# Patient Record
Sex: Male | Born: 1945 | Race: White | Hispanic: No | Marital: Married | Smoking: Former smoker
Health system: Southern US, Community
[De-identification: ages and names within clinical notes are randomized; demographics above are authoritative.]

## PROBLEM LIST (undated history)

## (undated) DIAGNOSIS — J45909 Unspecified asthma, uncomplicated: Secondary | ICD-10-CM

## (undated) DIAGNOSIS — E7800 Pure hypercholesterolemia, unspecified: Secondary | ICD-10-CM

## (undated) DIAGNOSIS — J189 Pneumonia, unspecified organism: Secondary | ICD-10-CM

## (undated) HISTORY — PX: ROTATOR CUFF REPAIR: SHX139

## (undated) HISTORY — PX: COLONOSCOPY: SHX174

## (undated) HISTORY — DX: Pneumonia, unspecified organism: J18.9

## (undated) HISTORY — DX: Unspecified asthma, uncomplicated: J45.909

## (undated) HISTORY — PX: CARPAL TUNNEL RELEASE: SHX101

## (undated) HISTORY — PX: ESOPHAGOGASTRODUODENOSCOPY: SHX1529

## (undated) HISTORY — PX: HERNIA REPAIR: SHX51

## (undated) HISTORY — PX: BRONCHOSCOPY: SUR163

## (undated) HISTORY — DX: Pure hypercholesterolemia, unspecified: E78.00

---

## 2015-05-18 DIAGNOSIS — H2512 Age-related nuclear cataract, left eye: Secondary | ICD-10-CM | POA: Diagnosis not present

## 2015-05-18 DIAGNOSIS — H26491 Other secondary cataract, right eye: Secondary | ICD-10-CM | POA: Diagnosis not present

## 2015-07-14 DIAGNOSIS — H1859 Other hereditary corneal dystrophies: Secondary | ICD-10-CM | POA: Diagnosis not present

## 2015-07-14 DIAGNOSIS — H25812 Combined forms of age-related cataract, left eye: Secondary | ICD-10-CM | POA: Diagnosis not present

## 2015-08-09 DIAGNOSIS — H1859 Other hereditary corneal dystrophies: Secondary | ICD-10-CM | POA: Diagnosis not present

## 2015-08-09 DIAGNOSIS — H25812 Combined forms of age-related cataract, left eye: Secondary | ICD-10-CM | POA: Diagnosis not present

## 2015-08-16 DIAGNOSIS — H1859 Other hereditary corneal dystrophies: Secondary | ICD-10-CM | POA: Diagnosis not present

## 2015-08-25 DIAGNOSIS — M1990 Unspecified osteoarthritis, unspecified site: Secondary | ICD-10-CM | POA: Diagnosis not present

## 2015-08-25 DIAGNOSIS — F419 Anxiety disorder, unspecified: Secondary | ICD-10-CM | POA: Diagnosis not present

## 2015-08-25 DIAGNOSIS — Z6829 Body mass index (BMI) 29.0-29.9, adult: Secondary | ICD-10-CM | POA: Diagnosis not present

## 2015-08-25 DIAGNOSIS — J449 Chronic obstructive pulmonary disease, unspecified: Secondary | ICD-10-CM | POA: Diagnosis not present

## 2015-08-25 DIAGNOSIS — Z125 Encounter for screening for malignant neoplasm of prostate: Secondary | ICD-10-CM | POA: Diagnosis not present

## 2015-08-25 DIAGNOSIS — J189 Pneumonia, unspecified organism: Secondary | ICD-10-CM | POA: Diagnosis not present

## 2015-08-25 DIAGNOSIS — R739 Hyperglycemia, unspecified: Secondary | ICD-10-CM | POA: Diagnosis not present

## 2015-08-25 DIAGNOSIS — G4700 Insomnia, unspecified: Secondary | ICD-10-CM | POA: Diagnosis not present

## 2015-08-25 DIAGNOSIS — R634 Abnormal weight loss: Secondary | ICD-10-CM | POA: Diagnosis not present

## 2015-08-25 DIAGNOSIS — M62838 Other muscle spasm: Secondary | ICD-10-CM | POA: Diagnosis not present

## 2015-08-25 DIAGNOSIS — E663 Overweight: Secondary | ICD-10-CM | POA: Diagnosis not present

## 2015-08-25 DIAGNOSIS — E785 Hyperlipidemia, unspecified: Secondary | ICD-10-CM | POA: Diagnosis not present

## 2015-10-10 DIAGNOSIS — Z79899 Other long term (current) drug therapy: Secondary | ICD-10-CM | POA: Diagnosis not present

## 2015-10-10 DIAGNOSIS — H25812 Combined forms of age-related cataract, left eye: Secondary | ICD-10-CM | POA: Diagnosis not present

## 2015-10-10 DIAGNOSIS — Z87891 Personal history of nicotine dependence: Secondary | ICD-10-CM | POA: Diagnosis not present

## 2015-10-10 DIAGNOSIS — K219 Gastro-esophageal reflux disease without esophagitis: Secondary | ICD-10-CM | POA: Diagnosis not present

## 2015-10-10 DIAGNOSIS — E785 Hyperlipidemia, unspecified: Secondary | ICD-10-CM | POA: Diagnosis not present

## 2015-10-10 DIAGNOSIS — H9190 Unspecified hearing loss, unspecified ear: Secondary | ICD-10-CM | POA: Diagnosis not present

## 2015-12-04 DIAGNOSIS — J189 Pneumonia, unspecified organism: Secondary | ICD-10-CM | POA: Diagnosis not present

## 2015-12-04 DIAGNOSIS — F419 Anxiety disorder, unspecified: Secondary | ICD-10-CM | POA: Diagnosis not present

## 2015-12-04 DIAGNOSIS — M1990 Unspecified osteoarthritis, unspecified site: Secondary | ICD-10-CM | POA: Diagnosis not present

## 2015-12-04 DIAGNOSIS — J449 Chronic obstructive pulmonary disease, unspecified: Secondary | ICD-10-CM | POA: Diagnosis not present

## 2015-12-04 DIAGNOSIS — Z6829 Body mass index (BMI) 29.0-29.9, adult: Secondary | ICD-10-CM | POA: Diagnosis not present

## 2015-12-04 DIAGNOSIS — E785 Hyperlipidemia, unspecified: Secondary | ICD-10-CM | POA: Diagnosis not present

## 2016-02-09 DIAGNOSIS — Z0000 Encounter for general adult medical examination without abnormal findings: Secondary | ICD-10-CM | POA: Diagnosis not present

## 2016-02-09 DIAGNOSIS — E785 Hyperlipidemia, unspecified: Secondary | ICD-10-CM | POA: Diagnosis not present

## 2016-02-09 DIAGNOSIS — Z6829 Body mass index (BMI) 29.0-29.9, adult: Secondary | ICD-10-CM | POA: Diagnosis not present

## 2016-02-09 DIAGNOSIS — F419 Anxiety disorder, unspecified: Secondary | ICD-10-CM | POA: Diagnosis not present

## 2016-02-09 DIAGNOSIS — M62838 Other muscle spasm: Secondary | ICD-10-CM | POA: Diagnosis not present

## 2016-02-09 DIAGNOSIS — G4700 Insomnia, unspecified: Secondary | ICD-10-CM | POA: Diagnosis not present

## 2016-02-09 DIAGNOSIS — M1990 Unspecified osteoarthritis, unspecified site: Secondary | ICD-10-CM | POA: Diagnosis not present

## 2016-02-09 DIAGNOSIS — Z23 Encounter for immunization: Secondary | ICD-10-CM | POA: Diagnosis not present

## 2016-02-09 DIAGNOSIS — J449 Chronic obstructive pulmonary disease, unspecified: Secondary | ICD-10-CM | POA: Diagnosis not present

## 2016-02-12 DIAGNOSIS — H26493 Other secondary cataract, bilateral: Secondary | ICD-10-CM | POA: Diagnosis not present

## 2016-02-12 DIAGNOSIS — H1850 Unspecified hereditary corneal dystrophies: Secondary | ICD-10-CM | POA: Diagnosis not present

## 2016-07-09 DIAGNOSIS — H1850 Unspecified hereditary corneal dystrophies: Secondary | ICD-10-CM | POA: Diagnosis not present

## 2016-08-22 DIAGNOSIS — J449 Chronic obstructive pulmonary disease, unspecified: Secondary | ICD-10-CM | POA: Diagnosis not present

## 2016-08-22 DIAGNOSIS — M1990 Unspecified osteoarthritis, unspecified site: Secondary | ICD-10-CM | POA: Diagnosis not present

## 2016-08-22 DIAGNOSIS — G4700 Insomnia, unspecified: Secondary | ICD-10-CM | POA: Diagnosis not present

## 2016-08-22 DIAGNOSIS — E785 Hyperlipidemia, unspecified: Secondary | ICD-10-CM | POA: Diagnosis not present

## 2016-08-22 DIAGNOSIS — Z6829 Body mass index (BMI) 29.0-29.9, adult: Secondary | ICD-10-CM | POA: Diagnosis not present

## 2016-08-22 DIAGNOSIS — M62838 Other muscle spasm: Secondary | ICD-10-CM | POA: Diagnosis not present

## 2016-08-22 DIAGNOSIS — R739 Hyperglycemia, unspecified: Secondary | ICD-10-CM | POA: Diagnosis not present

## 2016-08-22 DIAGNOSIS — M25511 Pain in right shoulder: Secondary | ICD-10-CM | POA: Diagnosis not present

## 2016-08-22 DIAGNOSIS — R221 Localized swelling, mass and lump, neck: Secondary | ICD-10-CM | POA: Diagnosis not present

## 2016-08-22 DIAGNOSIS — F419 Anxiety disorder, unspecified: Secondary | ICD-10-CM | POA: Diagnosis not present

## 2016-08-28 DIAGNOSIS — K219 Gastro-esophageal reflux disease without esophagitis: Secondary | ICD-10-CM | POA: Diagnosis not present

## 2016-08-28 DIAGNOSIS — R1310 Dysphagia, unspecified: Secondary | ICD-10-CM | POA: Diagnosis not present

## 2016-10-02 DIAGNOSIS — R112 Nausea with vomiting, unspecified: Secondary | ICD-10-CM | POA: Diagnosis not present

## 2016-10-02 DIAGNOSIS — Z6828 Body mass index (BMI) 28.0-28.9, adult: Secondary | ICD-10-CM | POA: Diagnosis not present

## 2016-10-02 DIAGNOSIS — K2971 Gastritis, unspecified, with bleeding: Secondary | ICD-10-CM | POA: Diagnosis not present

## 2016-10-02 DIAGNOSIS — R195 Other fecal abnormalities: Secondary | ICD-10-CM | POA: Diagnosis not present

## 2016-10-02 DIAGNOSIS — R1310 Dysphagia, unspecified: Secondary | ICD-10-CM | POA: Diagnosis not present

## 2016-10-02 DIAGNOSIS — K922 Gastrointestinal hemorrhage, unspecified: Secondary | ICD-10-CM | POA: Diagnosis not present

## 2016-10-03 DIAGNOSIS — K259 Gastric ulcer, unspecified as acute or chronic, without hemorrhage or perforation: Secondary | ICD-10-CM | POA: Diagnosis not present

## 2016-10-03 DIAGNOSIS — Z79899 Other long term (current) drug therapy: Secondary | ICD-10-CM | POA: Diagnosis not present

## 2016-10-03 DIAGNOSIS — K253 Acute gastric ulcer without hemorrhage or perforation: Secondary | ICD-10-CM | POA: Diagnosis not present

## 2016-10-03 DIAGNOSIS — D649 Anemia, unspecified: Secondary | ICD-10-CM | POA: Diagnosis not present

## 2016-10-03 DIAGNOSIS — K921 Melena: Secondary | ICD-10-CM | POA: Diagnosis not present

## 2016-10-03 DIAGNOSIS — Z86010 Personal history of colonic polyps: Secondary | ICD-10-CM | POA: Diagnosis not present

## 2016-10-03 DIAGNOSIS — K219 Gastro-esophageal reflux disease without esophagitis: Secondary | ICD-10-CM | POA: Diagnosis not present

## 2016-10-10 DIAGNOSIS — D649 Anemia, unspecified: Secondary | ICD-10-CM | POA: Diagnosis not present

## 2016-10-25 DIAGNOSIS — R739 Hyperglycemia, unspecified: Secondary | ICD-10-CM | POA: Diagnosis not present

## 2016-10-25 DIAGNOSIS — Z6828 Body mass index (BMI) 28.0-28.9, adult: Secondary | ICD-10-CM | POA: Diagnosis not present

## 2016-10-25 DIAGNOSIS — J449 Chronic obstructive pulmonary disease, unspecified: Secondary | ICD-10-CM | POA: Diagnosis not present

## 2016-10-25 DIAGNOSIS — K2971 Gastritis, unspecified, with bleeding: Secondary | ICD-10-CM | POA: Diagnosis not present

## 2017-02-13 DIAGNOSIS — Z79899 Other long term (current) drug therapy: Secondary | ICD-10-CM | POA: Diagnosis not present

## 2017-02-13 DIAGNOSIS — R739 Hyperglycemia, unspecified: Secondary | ICD-10-CM | POA: Diagnosis not present

## 2017-02-13 DIAGNOSIS — G4700 Insomnia, unspecified: Secondary | ICD-10-CM | POA: Diagnosis not present

## 2017-02-13 DIAGNOSIS — F419 Anxiety disorder, unspecified: Secondary | ICD-10-CM | POA: Diagnosis not present

## 2017-02-13 DIAGNOSIS — J449 Chronic obstructive pulmonary disease, unspecified: Secondary | ICD-10-CM | POA: Diagnosis not present

## 2017-02-13 DIAGNOSIS — E785 Hyperlipidemia, unspecified: Secondary | ICD-10-CM | POA: Diagnosis not present

## 2017-02-13 DIAGNOSIS — Z125 Encounter for screening for malignant neoplasm of prostate: Secondary | ICD-10-CM | POA: Diagnosis not present

## 2017-02-13 DIAGNOSIS — Z23 Encounter for immunization: Secondary | ICD-10-CM | POA: Diagnosis not present

## 2017-02-13 DIAGNOSIS — M62838 Other muscle spasm: Secondary | ICD-10-CM | POA: Diagnosis not present

## 2017-02-13 DIAGNOSIS — M1990 Unspecified osteoarthritis, unspecified site: Secondary | ICD-10-CM | POA: Diagnosis not present

## 2017-02-13 DIAGNOSIS — D649 Anemia, unspecified: Secondary | ICD-10-CM | POA: Diagnosis not present

## 2017-02-13 DIAGNOSIS — Z6831 Body mass index (BMI) 31.0-31.9, adult: Secondary | ICD-10-CM | POA: Diagnosis not present

## 2017-04-03 DIAGNOSIS — Z6830 Body mass index (BMI) 30.0-30.9, adult: Secondary | ICD-10-CM | POA: Diagnosis not present

## 2017-04-03 DIAGNOSIS — E669 Obesity, unspecified: Secondary | ICD-10-CM | POA: Diagnosis not present

## 2017-04-03 DIAGNOSIS — D649 Anemia, unspecified: Secondary | ICD-10-CM | POA: Diagnosis not present

## 2017-04-03 DIAGNOSIS — M25511 Pain in right shoulder: Secondary | ICD-10-CM | POA: Diagnosis not present

## 2017-04-03 DIAGNOSIS — K2971 Gastritis, unspecified, with bleeding: Secondary | ICD-10-CM | POA: Diagnosis not present

## 2017-04-03 DIAGNOSIS — Z79899 Other long term (current) drug therapy: Secondary | ICD-10-CM | POA: Diagnosis not present

## 2017-06-17 DIAGNOSIS — M25561 Pain in right knee: Secondary | ICD-10-CM | POA: Diagnosis not present

## 2017-06-17 DIAGNOSIS — F419 Anxiety disorder, unspecified: Secondary | ICD-10-CM | POA: Diagnosis not present

## 2017-06-17 DIAGNOSIS — J449 Chronic obstructive pulmonary disease, unspecified: Secondary | ICD-10-CM | POA: Diagnosis not present

## 2017-06-17 DIAGNOSIS — G4700 Insomnia, unspecified: Secondary | ICD-10-CM | POA: Diagnosis not present

## 2017-06-17 DIAGNOSIS — Z6830 Body mass index (BMI) 30.0-30.9, adult: Secondary | ICD-10-CM | POA: Diagnosis not present

## 2017-06-17 DIAGNOSIS — M25551 Pain in right hip: Secondary | ICD-10-CM | POA: Diagnosis not present

## 2017-06-18 DIAGNOSIS — M1611 Unilateral primary osteoarthritis, right hip: Secondary | ICD-10-CM | POA: Diagnosis not present

## 2017-06-18 DIAGNOSIS — M25561 Pain in right knee: Secondary | ICD-10-CM | POA: Diagnosis not present

## 2017-06-18 DIAGNOSIS — M25551 Pain in right hip: Secondary | ICD-10-CM | POA: Diagnosis not present

## 2017-08-13 DIAGNOSIS — Z1331 Encounter for screening for depression: Secondary | ICD-10-CM | POA: Diagnosis not present

## 2017-08-13 DIAGNOSIS — M1990 Unspecified osteoarthritis, unspecified site: Secondary | ICD-10-CM | POA: Diagnosis not present

## 2017-08-13 DIAGNOSIS — J449 Chronic obstructive pulmonary disease, unspecified: Secondary | ICD-10-CM | POA: Diagnosis not present

## 2017-08-13 DIAGNOSIS — R739 Hyperglycemia, unspecified: Secondary | ICD-10-CM | POA: Diagnosis not present

## 2017-08-13 DIAGNOSIS — G4700 Insomnia, unspecified: Secondary | ICD-10-CM | POA: Diagnosis not present

## 2017-08-13 DIAGNOSIS — F419 Anxiety disorder, unspecified: Secondary | ICD-10-CM | POA: Diagnosis not present

## 2017-08-13 DIAGNOSIS — Z9181 History of falling: Secondary | ICD-10-CM | POA: Diagnosis not present

## 2017-08-13 DIAGNOSIS — Z6830 Body mass index (BMI) 30.0-30.9, adult: Secondary | ICD-10-CM | POA: Diagnosis not present

## 2017-08-13 DIAGNOSIS — E785 Hyperlipidemia, unspecified: Secondary | ICD-10-CM | POA: Diagnosis not present

## 2018-01-22 DIAGNOSIS — Z6828 Body mass index (BMI) 28.0-28.9, adult: Secondary | ICD-10-CM | POA: Diagnosis not present

## 2018-01-22 DIAGNOSIS — K2971 Gastritis, unspecified, with bleeding: Secondary | ICD-10-CM | POA: Diagnosis not present

## 2018-01-22 DIAGNOSIS — F419 Anxiety disorder, unspecified: Secondary | ICD-10-CM | POA: Diagnosis not present

## 2018-01-22 DIAGNOSIS — J449 Chronic obstructive pulmonary disease, unspecified: Secondary | ICD-10-CM | POA: Diagnosis not present

## 2018-01-22 DIAGNOSIS — G4700 Insomnia, unspecified: Secondary | ICD-10-CM | POA: Diagnosis not present

## 2018-01-22 DIAGNOSIS — E663 Overweight: Secondary | ICD-10-CM | POA: Diagnosis not present

## 2018-01-22 DIAGNOSIS — M1990 Unspecified osteoarthritis, unspecified site: Secondary | ICD-10-CM | POA: Diagnosis not present

## 2018-02-04 DIAGNOSIS — Z23 Encounter for immunization: Secondary | ICD-10-CM | POA: Diagnosis not present

## 2018-07-15 DIAGNOSIS — X58XXXA Exposure to other specified factors, initial encounter: Secondary | ICD-10-CM | POA: Diagnosis not present

## 2018-07-15 DIAGNOSIS — E876 Hypokalemia: Secondary | ICD-10-CM | POA: Diagnosis not present

## 2018-07-15 DIAGNOSIS — E785 Hyperlipidemia, unspecified: Secondary | ICD-10-CM | POA: Diagnosis not present

## 2018-07-15 DIAGNOSIS — Z7984 Long term (current) use of oral hypoglycemic drugs: Secondary | ICD-10-CM | POA: Diagnosis not present

## 2018-07-15 DIAGNOSIS — G4762 Sleep related leg cramps: Secondary | ICD-10-CM | POA: Diagnosis not present

## 2018-07-15 DIAGNOSIS — I5032 Chronic diastolic (congestive) heart failure: Secondary | ICD-10-CM | POA: Diagnosis not present

## 2018-07-15 DIAGNOSIS — M1990 Unspecified osteoarthritis, unspecified site: Secondary | ICD-10-CM | POA: Diagnosis not present

## 2018-07-15 DIAGNOSIS — M79605 Pain in left leg: Secondary | ICD-10-CM | POA: Diagnosis not present

## 2018-07-15 DIAGNOSIS — J449 Chronic obstructive pulmonary disease, unspecified: Secondary | ICD-10-CM | POA: Diagnosis not present

## 2018-07-15 DIAGNOSIS — M79641 Pain in right hand: Secondary | ICD-10-CM | POA: Diagnosis not present

## 2018-07-15 DIAGNOSIS — F419 Anxiety disorder, unspecified: Secondary | ICD-10-CM | POA: Diagnosis not present

## 2018-07-15 DIAGNOSIS — Z79899 Other long term (current) drug therapy: Secondary | ICD-10-CM | POA: Diagnosis not present

## 2018-07-15 DIAGNOSIS — K219 Gastro-esophageal reflux disease without esophagitis: Secondary | ICD-10-CM | POA: Diagnosis not present

## 2018-07-15 DIAGNOSIS — J301 Allergic rhinitis due to pollen: Secondary | ICD-10-CM | POA: Diagnosis not present

## 2018-07-15 DIAGNOSIS — R739 Hyperglycemia, unspecified: Secondary | ICD-10-CM | POA: Diagnosis not present

## 2018-07-15 DIAGNOSIS — R0602 Shortness of breath: Secondary | ICD-10-CM | POA: Diagnosis not present

## 2018-07-15 DIAGNOSIS — Z6828 Body mass index (BMI) 28.0-28.9, adult: Secondary | ICD-10-CM | POA: Diagnosis not present

## 2018-07-15 DIAGNOSIS — G4700 Insomnia, unspecified: Secondary | ICD-10-CM | POA: Diagnosis not present

## 2018-07-15 DIAGNOSIS — K2971 Gastritis, unspecified, with bleeding: Secondary | ICD-10-CM | POA: Diagnosis not present

## 2018-07-15 DIAGNOSIS — F329 Major depressive disorder, single episode, unspecified: Secondary | ICD-10-CM | POA: Diagnosis not present

## 2018-07-15 DIAGNOSIS — R6889 Other general symptoms and signs: Secondary | ICD-10-CM | POA: Diagnosis not present

## 2018-07-15 DIAGNOSIS — I110 Hypertensive heart disease with heart failure: Secondary | ICD-10-CM | POA: Diagnosis not present

## 2018-07-15 DIAGNOSIS — S81802A Unspecified open wound, left lower leg, initial encounter: Secondary | ICD-10-CM | POA: Diagnosis not present

## 2018-07-15 DIAGNOSIS — E1149 Type 2 diabetes mellitus with other diabetic neurological complication: Secondary | ICD-10-CM | POA: Diagnosis not present

## 2018-07-15 DIAGNOSIS — M79642 Pain in left hand: Secondary | ICD-10-CM | POA: Diagnosis not present

## 2018-07-15 DIAGNOSIS — J3089 Other allergic rhinitis: Secondary | ICD-10-CM | POA: Diagnosis not present

## 2018-07-21 DIAGNOSIS — Z8673 Personal history of transient ischemic attack (TIA), and cerebral infarction without residual deficits: Secondary | ICD-10-CM | POA: Diagnosis not present

## 2018-07-21 DIAGNOSIS — F419 Anxiety disorder, unspecified: Secondary | ICD-10-CM | POA: Diagnosis not present

## 2018-07-21 DIAGNOSIS — K8020 Calculus of gallbladder without cholecystitis without obstruction: Secondary | ICD-10-CM | POA: Diagnosis not present

## 2018-07-21 DIAGNOSIS — N281 Cyst of kidney, acquired: Secondary | ICD-10-CM | POA: Diagnosis not present

## 2018-07-21 DIAGNOSIS — E039 Hypothyroidism, unspecified: Secondary | ICD-10-CM | POA: Diagnosis not present

## 2018-07-21 DIAGNOSIS — R109 Unspecified abdominal pain: Secondary | ICD-10-CM | POA: Diagnosis not present

## 2018-07-21 DIAGNOSIS — T45525A Adverse effect of antithrombotic drugs, initial encounter: Secondary | ICD-10-CM | POA: Diagnosis not present

## 2018-07-21 DIAGNOSIS — Z8249 Family history of ischemic heart disease and other diseases of the circulatory system: Secondary | ICD-10-CM | POA: Diagnosis not present

## 2018-07-21 DIAGNOSIS — K8080 Other cholelithiasis without obstruction: Secondary | ICD-10-CM | POA: Diagnosis not present

## 2018-07-21 DIAGNOSIS — R0602 Shortness of breath: Secondary | ICD-10-CM | POA: Diagnosis not present

## 2018-07-21 DIAGNOSIS — I080 Rheumatic disorders of both mitral and aortic valves: Secondary | ICD-10-CM | POA: Diagnosis not present

## 2018-07-21 DIAGNOSIS — I214 Non-ST elevation (NSTEMI) myocardial infarction: Secondary | ICD-10-CM | POA: Diagnosis not present

## 2018-07-21 DIAGNOSIS — R32 Unspecified urinary incontinence: Secondary | ICD-10-CM | POA: Diagnosis not present

## 2018-07-21 DIAGNOSIS — E785 Hyperlipidemia, unspecified: Secondary | ICD-10-CM | POA: Diagnosis not present

## 2018-07-21 DIAGNOSIS — Z95828 Presence of other vascular implants and grafts: Secondary | ICD-10-CM | POA: Diagnosis not present

## 2018-07-21 DIAGNOSIS — I10 Essential (primary) hypertension: Secondary | ICD-10-CM | POA: Diagnosis not present

## 2018-08-28 DIAGNOSIS — G4700 Insomnia, unspecified: Secondary | ICD-10-CM | POA: Diagnosis not present

## 2018-08-28 DIAGNOSIS — M62838 Other muscle spasm: Secondary | ICD-10-CM | POA: Diagnosis not present

## 2018-08-28 DIAGNOSIS — Z9181 History of falling: Secondary | ICD-10-CM | POA: Diagnosis not present

## 2018-08-28 DIAGNOSIS — E785 Hyperlipidemia, unspecified: Secondary | ICD-10-CM | POA: Diagnosis not present

## 2018-08-28 DIAGNOSIS — J449 Chronic obstructive pulmonary disease, unspecified: Secondary | ICD-10-CM | POA: Diagnosis not present

## 2018-08-28 DIAGNOSIS — Z1331 Encounter for screening for depression: Secondary | ICD-10-CM | POA: Diagnosis not present

## 2018-08-28 DIAGNOSIS — M1990 Unspecified osteoarthritis, unspecified site: Secondary | ICD-10-CM | POA: Diagnosis not present

## 2018-08-28 DIAGNOSIS — K2971 Gastritis, unspecified, with bleeding: Secondary | ICD-10-CM | POA: Diagnosis not present

## 2018-08-28 DIAGNOSIS — F419 Anxiety disorder, unspecified: Secondary | ICD-10-CM | POA: Diagnosis not present

## 2018-09-24 DIAGNOSIS — J449 Chronic obstructive pulmonary disease, unspecified: Secondary | ICD-10-CM | POA: Diagnosis not present

## 2018-09-24 DIAGNOSIS — M25511 Pain in right shoulder: Secondary | ICD-10-CM | POA: Diagnosis not present

## 2018-09-24 DIAGNOSIS — Z6828 Body mass index (BMI) 28.0-28.9, adult: Secondary | ICD-10-CM | POA: Diagnosis not present

## 2018-12-02 DIAGNOSIS — E785 Hyperlipidemia, unspecified: Secondary | ICD-10-CM | POA: Diagnosis not present

## 2018-12-02 DIAGNOSIS — Z79899 Other long term (current) drug therapy: Secondary | ICD-10-CM | POA: Diagnosis not present

## 2018-12-02 DIAGNOSIS — R739 Hyperglycemia, unspecified: Secondary | ICD-10-CM | POA: Diagnosis not present

## 2018-12-02 DIAGNOSIS — J310 Chronic rhinitis: Secondary | ICD-10-CM | POA: Diagnosis not present

## 2019-01-07 DIAGNOSIS — Z23 Encounter for immunization: Secondary | ICD-10-CM | POA: Diagnosis not present

## 2019-01-14 DIAGNOSIS — M19011 Primary osteoarthritis, right shoulder: Secondary | ICD-10-CM | POA: Diagnosis not present

## 2019-01-21 DIAGNOSIS — J449 Chronic obstructive pulmonary disease, unspecified: Secondary | ICD-10-CM | POA: Diagnosis not present

## 2019-03-04 DIAGNOSIS — J449 Chronic obstructive pulmonary disease, unspecified: Secondary | ICD-10-CM | POA: Diagnosis not present

## 2019-03-04 DIAGNOSIS — E785 Hyperlipidemia, unspecified: Secondary | ICD-10-CM | POA: Diagnosis not present

## 2019-03-04 DIAGNOSIS — G4700 Insomnia, unspecified: Secondary | ICD-10-CM | POA: Diagnosis not present

## 2019-03-04 DIAGNOSIS — R739 Hyperglycemia, unspecified: Secondary | ICD-10-CM | POA: Diagnosis not present

## 2019-03-04 DIAGNOSIS — M62838 Other muscle spasm: Secondary | ICD-10-CM | POA: Diagnosis not present

## 2019-04-05 DIAGNOSIS — J449 Chronic obstructive pulmonary disease, unspecified: Secondary | ICD-10-CM | POA: Diagnosis not present

## 2019-05-13 DIAGNOSIS — M25511 Pain in right shoulder: Secondary | ICD-10-CM | POA: Diagnosis not present

## 2019-05-17 DIAGNOSIS — B349 Viral infection, unspecified: Secondary | ICD-10-CM | POA: Diagnosis not present

## 2019-05-17 DIAGNOSIS — J020 Streptococcal pharyngitis: Secondary | ICD-10-CM | POA: Diagnosis not present

## 2019-05-21 DIAGNOSIS — J449 Chronic obstructive pulmonary disease, unspecified: Secondary | ICD-10-CM | POA: Diagnosis not present

## 2019-06-30 DIAGNOSIS — K253 Acute gastric ulcer without hemorrhage or perforation: Secondary | ICD-10-CM | POA: Diagnosis not present

## 2019-07-13 DIAGNOSIS — E785 Hyperlipidemia, unspecified: Secondary | ICD-10-CM | POA: Diagnosis not present

## 2019-07-13 DIAGNOSIS — Z1211 Encounter for screening for malignant neoplasm of colon: Secondary | ICD-10-CM | POA: Diagnosis not present

## 2019-07-13 DIAGNOSIS — K2991 Gastroduodenitis, unspecified, with bleeding: Secondary | ICD-10-CM | POA: Diagnosis not present

## 2019-07-13 DIAGNOSIS — Z139 Encounter for screening, unspecified: Secondary | ICD-10-CM | POA: Diagnosis not present

## 2019-07-13 DIAGNOSIS — R739 Hyperglycemia, unspecified: Secondary | ICD-10-CM | POA: Diagnosis not present

## 2019-08-10 DIAGNOSIS — R1310 Dysphagia, unspecified: Secondary | ICD-10-CM | POA: Diagnosis not present

## 2019-08-10 DIAGNOSIS — J342 Deviated nasal septum: Secondary | ICD-10-CM | POA: Diagnosis not present

## 2019-08-10 DIAGNOSIS — Z87891 Personal history of nicotine dependence: Secondary | ICD-10-CM | POA: Diagnosis not present

## 2019-08-10 DIAGNOSIS — R0989 Other specified symptoms and signs involving the circulatory and respiratory systems: Secondary | ICD-10-CM | POA: Diagnosis not present

## 2019-08-10 DIAGNOSIS — R070 Pain in throat: Secondary | ICD-10-CM | POA: Diagnosis not present

## 2019-08-20 DIAGNOSIS — R1310 Dysphagia, unspecified: Secondary | ICD-10-CM | POA: Diagnosis not present

## 2019-08-20 DIAGNOSIS — K225 Diverticulum of esophagus, acquired: Secondary | ICD-10-CM | POA: Diagnosis not present

## 2019-08-31 DIAGNOSIS — R1310 Dysphagia, unspecified: Secondary | ICD-10-CM | POA: Diagnosis not present

## 2019-08-31 DIAGNOSIS — Q396 Congenital diverticulum of esophagus: Secondary | ICD-10-CM | POA: Diagnosis not present

## 2019-10-12 DIAGNOSIS — S2242XD Multiple fractures of ribs, left side, subsequent encounter for fracture with routine healing: Secondary | ICD-10-CM | POA: Diagnosis not present

## 2019-10-12 DIAGNOSIS — E871 Hypo-osmolality and hyponatremia: Secondary | ICD-10-CM | POA: Diagnosis not present

## 2019-10-12 DIAGNOSIS — R531 Weakness: Secondary | ICD-10-CM | POA: Diagnosis not present

## 2019-10-12 DIAGNOSIS — J439 Emphysema, unspecified: Secondary | ICD-10-CM | POA: Diagnosis not present

## 2019-10-12 DIAGNOSIS — J189 Pneumonia, unspecified organism: Secondary | ICD-10-CM | POA: Diagnosis not present

## 2019-10-12 DIAGNOSIS — Z79899 Other long term (current) drug therapy: Secondary | ICD-10-CM | POA: Diagnosis not present

## 2019-10-12 DIAGNOSIS — R031 Nonspecific low blood-pressure reading: Secondary | ICD-10-CM | POA: Diagnosis not present

## 2019-10-12 DIAGNOSIS — R1012 Left upper quadrant pain: Secondary | ICD-10-CM | POA: Diagnosis not present

## 2019-10-12 DIAGNOSIS — R05 Cough: Secondary | ICD-10-CM | POA: Diagnosis not present

## 2019-10-12 DIAGNOSIS — J181 Lobar pneumonia, unspecified organism: Secondary | ICD-10-CM | POA: Diagnosis not present

## 2019-10-12 DIAGNOSIS — J45909 Unspecified asthma, uncomplicated: Secondary | ICD-10-CM | POA: Diagnosis not present

## 2019-10-12 DIAGNOSIS — I700 Atherosclerosis of aorta: Secondary | ICD-10-CM | POA: Diagnosis not present

## 2019-10-12 DIAGNOSIS — R634 Abnormal weight loss: Secondary | ICD-10-CM | POA: Diagnosis not present

## 2019-10-12 DIAGNOSIS — D72829 Elevated white blood cell count, unspecified: Secondary | ICD-10-CM | POA: Diagnosis not present

## 2019-10-12 DIAGNOSIS — K225 Diverticulum of esophagus, acquired: Secondary | ICD-10-CM | POA: Diagnosis not present

## 2019-10-12 DIAGNOSIS — R093 Abnormal sputum: Secondary | ICD-10-CM | POA: Diagnosis not present

## 2019-10-12 DIAGNOSIS — J984 Other disorders of lung: Secondary | ICD-10-CM | POA: Diagnosis not present

## 2019-10-12 DIAGNOSIS — R0781 Pleurodynia: Secondary | ICD-10-CM | POA: Diagnosis not present

## 2019-10-22 ENCOUNTER — Encounter: Payer: Self-pay | Admitting: Otolaryngology

## 2019-10-22 DIAGNOSIS — R918 Other nonspecific abnormal finding of lung field: Secondary | ICD-10-CM | POA: Diagnosis not present

## 2019-10-22 DIAGNOSIS — J479 Bronchiectasis, uncomplicated: Secondary | ICD-10-CM | POA: Diagnosis not present

## 2019-10-22 DIAGNOSIS — J4540 Moderate persistent asthma, uncomplicated: Secondary | ICD-10-CM | POA: Diagnosis not present

## 2019-10-28 ENCOUNTER — Telehealth: Payer: Self-pay | Admitting: Gastroenterology

## 2019-11-10 DIAGNOSIS — E785 Hyperlipidemia, unspecified: Secondary | ICD-10-CM | POA: Diagnosis not present

## 2019-11-10 DIAGNOSIS — Z9181 History of falling: Secondary | ICD-10-CM | POA: Diagnosis not present

## 2019-11-10 DIAGNOSIS — R778 Other specified abnormalities of plasma proteins: Secondary | ICD-10-CM | POA: Diagnosis not present

## 2019-11-10 DIAGNOSIS — R739 Hyperglycemia, unspecified: Secondary | ICD-10-CM | POA: Diagnosis not present

## 2019-11-11 DIAGNOSIS — J479 Bronchiectasis, uncomplicated: Secondary | ICD-10-CM | POA: Diagnosis not present

## 2019-11-11 DIAGNOSIS — J4540 Moderate persistent asthma, uncomplicated: Secondary | ICD-10-CM | POA: Diagnosis not present

## 2019-11-11 DIAGNOSIS — R918 Other nonspecific abnormal finding of lung field: Secondary | ICD-10-CM | POA: Diagnosis not present

## 2019-11-12 DIAGNOSIS — R918 Other nonspecific abnormal finding of lung field: Secondary | ICD-10-CM | POA: Diagnosis not present

## 2019-11-12 DIAGNOSIS — J479 Bronchiectasis, uncomplicated: Secondary | ICD-10-CM | POA: Diagnosis not present

## 2019-11-12 DIAGNOSIS — J4540 Moderate persistent asthma, uncomplicated: Secondary | ICD-10-CM | POA: Diagnosis not present

## 2019-11-15 DIAGNOSIS — Z1152 Encounter for screening for COVID-19: Secondary | ICD-10-CM | POA: Diagnosis not present

## 2019-11-16 DIAGNOSIS — M549 Dorsalgia, unspecified: Secondary | ICD-10-CM | POA: Diagnosis not present

## 2019-11-16 DIAGNOSIS — J439 Emphysema, unspecified: Secondary | ICD-10-CM | POA: Diagnosis not present

## 2019-11-16 DIAGNOSIS — J8410 Pulmonary fibrosis, unspecified: Secondary | ICD-10-CM | POA: Diagnosis not present

## 2019-11-16 DIAGNOSIS — R846 Abnormal cytological findings in specimens from respiratory organs and thorax: Secondary | ICD-10-CM | POA: Diagnosis not present

## 2019-11-16 DIAGNOSIS — Z79899 Other long term (current) drug therapy: Secondary | ICD-10-CM | POA: Diagnosis not present

## 2019-11-16 DIAGNOSIS — K219 Gastro-esophageal reflux disease without esophagitis: Secondary | ICD-10-CM | POA: Diagnosis not present

## 2019-11-16 DIAGNOSIS — J4540 Moderate persistent asthma, uncomplicated: Secondary | ICD-10-CM | POA: Diagnosis not present

## 2019-11-16 DIAGNOSIS — J40 Bronchitis, not specified as acute or chronic: Secondary | ICD-10-CM | POA: Diagnosis not present

## 2019-11-16 DIAGNOSIS — Z87891 Personal history of nicotine dependence: Secondary | ICD-10-CM | POA: Diagnosis not present

## 2019-11-16 DIAGNOSIS — J9809 Other diseases of bronchus, not elsewhere classified: Secondary | ICD-10-CM | POA: Diagnosis not present

## 2019-11-16 DIAGNOSIS — R918 Other nonspecific abnormal finding of lung field: Secondary | ICD-10-CM | POA: Diagnosis not present

## 2019-11-23 DIAGNOSIS — J479 Bronchiectasis, uncomplicated: Secondary | ICD-10-CM | POA: Diagnosis not present

## 2019-11-23 DIAGNOSIS — R918 Other nonspecific abnormal finding of lung field: Secondary | ICD-10-CM | POA: Diagnosis not present

## 2019-11-23 DIAGNOSIS — J4540 Moderate persistent asthma, uncomplicated: Secondary | ICD-10-CM | POA: Diagnosis not present

## 2019-12-03 ENCOUNTER — Encounter: Payer: Self-pay | Admitting: Gastroenterology

## 2019-12-23 ENCOUNTER — Encounter: Payer: Self-pay | Admitting: Gastroenterology

## 2019-12-23 ENCOUNTER — Ambulatory Visit: Payer: Medicare Other | Admitting: Gastroenterology

## 2019-12-23 VITALS — BP 106/60 | HR 78 | Ht 68.0 in | Wt 180.5 lb

## 2019-12-23 DIAGNOSIS — K225 Diverticulum of esophagus, acquired: Secondary | ICD-10-CM

## 2019-12-23 DIAGNOSIS — K219 Gastro-esophageal reflux disease without esophagitis: Secondary | ICD-10-CM | POA: Diagnosis not present

## 2019-12-23 NOTE — Patient Instructions (Addendum)
If you are age 75 or older, your body mass index should be between 23-30. Your Body mass index is 27.44 kg/m. If this is out of the aforementioned range listed, please consider follow up with your Primary Care Provider.  If you are age 58 or younger, your body mass index should be between 19-25. Your Body mass index is 27.44 kg/m. If this is out of the aformentioned range listed, please consider follow up with your Primary Care Provider.   You have been scheduled for a Barium Esophogram at [facility]  (1st floor of the hospital) on 12/28/19  At 8:30am . Please arrive 15 minutes prior to your appointment for registration. Make certain not to have anything to eat or drink 3 hours prior to your test. If you need to reschedule for any reason, please contact radiology at [phone] to do so. __________________________________________________________________ A barium swallow is an examination that concentrates on views of the esophagus. This tends to be a double contrast exam (barium and two liquids which, when combined, create a gas to distend the wall of the oesophagus) or single contrast (non-ionic iodine based). The study is usually tailored to your symptoms so a good history is essential. Attention is paid during the study to the form, structure and configuration of the esophagus, looking for functional disorders (such as aspiration, dysphagia, achalasia, motility and reflux) EXAMINATION You may be asked to change into a gown, depending on the type of swallow being performed. A radiologist and radiographer will perform the procedure. The radiologist will advise you of the type of contrast selected for your procedure and direct you during the exam. You will be asked to stand, sit or lie in several different positions and to hold a small amount of fluid in your mouth before being asked to swallow while the imaging is performed .In some instances you may be asked to swallow barium coated marshmallows to  assess the motility of a solid food bolus. The exam can be recorded as a digital or video fluoroscopy procedure. POST PROCEDURE It will take 1-2 days for the barium to pass through your system. To facilitate this, it is important, unless otherwise directed, to increase your fluids for the next 24-48hrs and to resume your normal diet.  This test typically takes about 30 minutes to perform. __________________________________________________________________________________  Bonita Quin have been referred to Lenard Simmer, MD, you will be contacted with this appointment.   Thank you,  Dr. Lynann Bologna

## 2019-12-23 NOTE — Progress Notes (Signed)
Chief Complaint: Dysphagia.  Referring Provider:  Maudie Flakes, MD      ASSESSMENT AND PLAN;   #1. 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 GERD with H/O HH s/p fundoplication in remote past.  Last EGD by Dr. Jennye Boroughs 09/2016 showed mildly dilated tortuous esophagus.  Plan: -Nexium 40mg  po qd. #30, 11 refills.  Have discussed compliance. -Ba Swallow to delineate eso anatomy.  Repeat EGD only if Ba swallow is significantly abn. -Appt with Dr at [facility] for possible consideration of endoscopic diverticulectomy/CP myotomy/Z-POEM. -Aspiration precautions were discussed. [facility] to find out if there is any way to get MBS video from [facility] to Dr [place] [place] MCKINNEY for review.  I will hold off on CT neck at this time pending appt at [facility].   HPI:    Roy Luna is a 74 y.o. male  Accompanied by his wife.  We are still in process of getting records.  With H/O COPD, HLD, GERD with HH s/p repair, OA, LBP, H/O chronic prescription opioid use, anxiety/depression/sleep disorder/chronic pain  Has been having dysphagia mainly in the neck area with both solids and liquids x 10-12 months which is getting progressively worse. Has globus sensation and regurgitation of food/liquids.  Underwent ENT evaluation by Dr. 66 08/10/2019- neg office nasopharyngoscopy.  Referred for MBS (modified barium swallow)  Underwent MBS at [facility] 08/20/2019 showing -Golf ball sized pouch in the upper esophagus.  Liquids and solid foods consistently remain in the pouch and are not ejected.  No aspiration.  Moderate to severe residue in the piriform sinuses d/t backflow from esophagus.  Sent to GI clinic for further evaluation.  Has seen Dr. 10/20/2019.  Unfortunately he did not get along.  Referred here for another opinion.  Denies having any heartburn.  He has stopped taking Nexium on his own.  Denies having any abdominal pain.  No fever chills or night sweats.  No significant weight loss.  Denies having  any diarrhea or constipation.     Additional GI procedures: (Dr. Jennye Boroughs) -EGD 10/03/2016: Small clean-based ulcer at the site of prior fundoplication with dilated tortuous esophagus.  The esophagus never distended well.  -Colonoscopy 06/15/2010: Diminutive colonic polyps, mild diverticular disease.  Nonvisualization of the cecum.  Tortuous colon.  Thereafter underwent barium enema 06/28/2010 which was negative.  -Colonoscopy 08/2006 small hyperplastic and adenomatous polyps, colonoscopy 02/2003 adenomatous colonic polyps.  -EGD 09/1998 slipped fundoplication, sutures present, removal of sutures endoscopically.  EGD by Dr. 10/1998 01/2003 with hiatal hernia. Past Medical History:  Diagnosis Date   Asthma    Hypercholesterolemia    Pneumonia     Past Surgical History:  Procedure Laterality Date   CARPAL TUNNEL RELEASE Right    COLONOSCOPY     Around 2017 Dr 2018. in [place]   ESOPHAGOGASTRODUODENOSCOPY     HERNIA REPAIR     x2   NASAL ENDOSCOPY     2021 by a lung doctor. said what was staying in his throat was going to his throat    ROTATOR CUFF REPAIR      No family history on file.  Social History   Tobacco Use   Smoking status: Former Smoker   Smokeless tobacco: Never Used  2022 Use: Never used  Substance Use Topics   Alcohol use: Yes   Drug use: Not Currently    Current Outpatient Medications  Medication Sig Dispense Refill   albuterol (PROAIR HFA) 108 (90 Base) MCG/ACT inhaler Inhale into  the lungs every 6 (six) hours as needed for wheezing or shortness of breath.     diazepam (VALIUM) 5 MG tablet Take 5 mg by mouth daily as needed.     ipratropium-albuterol (DUONEB) 0.5-2.5 (3) MG/3ML SOLN SMARTSIG:3 Milliliter(s) Via Nebulizer Every 6 Hours     lovastatin (MEVACOR) 40 MG tablet Take 1 tablet by mouth daily.     traMADol (ULTRAM) 50 MG tablet Take 50 mg by mouth 2 (two) times daily as needed.     zolpidem (AMBIEN) 10 MG tablet  Take 10 mg by mouth at bedtime as needed.     No current facility-administered medications for this visit.    Not on File  Review of Systems:  Constitutional: Denies fever, chills, diaphoresis, appetite change and fatigue.  HEENT: Denies photophobia, eye pain, redness, hearing loss, ear pain, congestion, sore throat, rhinorrhea, sneezing, mouth sores, neck pain, neck stiffness and tinnitus.   Respiratory: Has SOB, DOE, cough, chest tightness,  and wheezing.   Cardiovascular: Denies chest pain, palpitations and leg swelling.  Genitourinary: Denies dysuria, urgency, frequency, hematuria, flank pain and difficulty urinating.  Musculoskeletal: Denies myalgias, back pain, joint swelling, arthralgias and gait problem.  Skin: No rash.  Neurological: Denies dizziness, seizures, syncope, weakness, light-headedness, numbness and headaches.  Hematological: Denies adenopathy. Easy bruising, personal or family bleeding history  Psychiatric/Behavioral: No anxiety or depression     Physical Exam:    BP 106/60    Pulse 78    Ht 5\' 8"  (1.727 m)    Wt 180 lb 8 oz (81.9 kg)    BMI 27.44 kg/m  Wt Readings from Last 3 Encounters:  12/23/19 180 lb 8 oz (81.9 kg)   Constitutional:  Well-developed, in no acute distress. Psychiatric: Normal mood and affect. Behavior is normal. HEENT: Pupils normal.  Conjunctivae are normal. No scleral icterus. Neck supple.  Cardiovascular: Normal rate, regular rhythm. No edema Pulmonary/chest: Barrel-shaped chest.  Bilateral decreased breath sounds.  No wheezing, rales or rhonchi. Abdominal: Soft, nondistended. Nontender. Bowel sounds active throughout. There are no masses palpable. No hepatomegaly. Rectal:  defered Neurological: Alert and oriented to person place and time. Skin: Skin is warm and dry. No rashes noted.  Data Reviewed: I have personally reviewed following labs and imaging studies  Cc Dr 02/22/20, Dr Noel Gerold   Blenda Nicely, MD 12/23/2019, 10:38 AM  Cc: 02/22/2020, MD

## 2019-12-28 ENCOUNTER — Ambulatory Visit (HOSPITAL_COMMUNITY): Payer: Medicare Other

## 2019-12-30 ENCOUNTER — Ambulatory Visit (HOSPITAL_COMMUNITY)
Admission: RE | Admit: 2019-12-30 | Discharge: 2019-12-30 | Disposition: A | Discharge status: Home / Self Care | Payer: Medicare Other | Source: Ambulatory Visit | Attending: Gastroenterology | Admitting: Gastroenterology

## 2019-12-30 DIAGNOSIS — K225 Diverticulum of esophagus, acquired: Secondary | ICD-10-CM | POA: Insufficient documentation

## 2019-12-30 DIAGNOSIS — K219 Gastro-esophageal reflux disease without esophagitis: Secondary | ICD-10-CM | POA: Diagnosis not present

## 2019-12-30 DIAGNOSIS — K449 Diaphragmatic hernia without obstruction or gangrene: Secondary | ICD-10-CM | POA: Diagnosis not present

## 2020-01-26 DIAGNOSIS — H53453 Other localized visual field defect, bilateral: Secondary | ICD-10-CM | POA: Diagnosis not present

## 2020-02-07 DIAGNOSIS — R1314 Dysphagia, pharyngoesophageal phase: Secondary | ICD-10-CM | POA: Diagnosis not present

## 2020-02-07 DIAGNOSIS — J383 Other diseases of vocal cords: Secondary | ICD-10-CM | POA: Diagnosis not present

## 2020-02-07 DIAGNOSIS — K225 Diverticulum of esophagus, acquired: Secondary | ICD-10-CM | POA: Diagnosis not present

## 2020-02-10 DIAGNOSIS — E785 Hyperlipidemia, unspecified: Secondary | ICD-10-CM | POA: Diagnosis not present

## 2020-02-10 DIAGNOSIS — Z23 Encounter for immunization: Secondary | ICD-10-CM | POA: Diagnosis not present

## 2020-02-10 DIAGNOSIS — R739 Hyperglycemia, unspecified: Secondary | ICD-10-CM | POA: Diagnosis not present

## 2020-03-06 DIAGNOSIS — Z87891 Personal history of nicotine dependence: Secondary | ICD-10-CM | POA: Diagnosis not present

## 2020-03-06 DIAGNOSIS — J45909 Unspecified asthma, uncomplicated: Secondary | ICD-10-CM | POA: Diagnosis not present

## 2020-03-06 DIAGNOSIS — Z8719 Personal history of other diseases of the digestive system: Secondary | ICD-10-CM | POA: Diagnosis not present

## 2020-03-06 DIAGNOSIS — R1314 Dysphagia, pharyngoesophageal phase: Secondary | ICD-10-CM | POA: Diagnosis not present

## 2020-03-06 DIAGNOSIS — K219 Gastro-esophageal reflux disease without esophagitis: Secondary | ICD-10-CM | POA: Diagnosis not present

## 2020-03-06 DIAGNOSIS — Z79899 Other long term (current) drug therapy: Secondary | ICD-10-CM | POA: Diagnosis not present

## 2020-03-06 DIAGNOSIS — K225 Diverticulum of esophagus, acquired: Secondary | ICD-10-CM | POA: Diagnosis not present

## 2020-05-25 DIAGNOSIS — R739 Hyperglycemia, unspecified: Secondary | ICD-10-CM | POA: Diagnosis not present

## 2020-05-25 DIAGNOSIS — G4700 Insomnia, unspecified: Secondary | ICD-10-CM | POA: Diagnosis not present

## 2020-05-25 DIAGNOSIS — M62838 Other muscle spasm: Secondary | ICD-10-CM | POA: Diagnosis not present

## 2020-05-25 DIAGNOSIS — Z79899 Other long term (current) drug therapy: Secondary | ICD-10-CM | POA: Diagnosis not present

## 2020-05-25 DIAGNOSIS — K2971 Gastritis, unspecified, with bleeding: Secondary | ICD-10-CM | POA: Diagnosis not present

## 2020-05-25 DIAGNOSIS — E785 Hyperlipidemia, unspecified: Secondary | ICD-10-CM | POA: Diagnosis not present

## 2020-05-25 DIAGNOSIS — K2991 Gastroduodenitis, unspecified, with bleeding: Secondary | ICD-10-CM | POA: Diagnosis not present

## 2020-05-25 DIAGNOSIS — M1990 Unspecified osteoarthritis, unspecified site: Secondary | ICD-10-CM | POA: Diagnosis not present

## 2020-05-25 DIAGNOSIS — J301 Allergic rhinitis due to pollen: Secondary | ICD-10-CM | POA: Diagnosis not present

## 2020-06-13 DIAGNOSIS — Z1389 Encounter for screening for other disorder: Secondary | ICD-10-CM | POA: Diagnosis not present

## 2020-06-13 DIAGNOSIS — G894 Chronic pain syndrome: Secondary | ICD-10-CM | POA: Diagnosis not present

## 2020-06-13 DIAGNOSIS — Z79899 Other long term (current) drug therapy: Secondary | ICD-10-CM | POA: Diagnosis not present

## 2020-08-17 DIAGNOSIS — K2971 Gastritis, unspecified, with bleeding: Secondary | ICD-10-CM | POA: Diagnosis not present

## 2020-08-17 DIAGNOSIS — M159 Polyosteoarthritis, unspecified: Secondary | ICD-10-CM | POA: Diagnosis not present

## 2020-08-17 DIAGNOSIS — R739 Hyperglycemia, unspecified: Secondary | ICD-10-CM | POA: Diagnosis not present

## 2020-08-17 DIAGNOSIS — Z139 Encounter for screening, unspecified: Secondary | ICD-10-CM | POA: Diagnosis not present

## 2020-08-17 DIAGNOSIS — M25561 Pain in right knee: Secondary | ICD-10-CM | POA: Diagnosis not present

## 2020-08-17 DIAGNOSIS — G4700 Insomnia, unspecified: Secondary | ICD-10-CM | POA: Diagnosis not present

## 2020-08-17 DIAGNOSIS — E785 Hyperlipidemia, unspecified: Secondary | ICD-10-CM | POA: Diagnosis not present

## 2020-08-17 DIAGNOSIS — J449 Chronic obstructive pulmonary disease, unspecified: Secondary | ICD-10-CM | POA: Diagnosis not present

## 2020-08-17 DIAGNOSIS — K2991 Gastroduodenitis, unspecified, with bleeding: Secondary | ICD-10-CM | POA: Diagnosis not present

## 2020-08-17 DIAGNOSIS — J301 Allergic rhinitis due to pollen: Secondary | ICD-10-CM | POA: Diagnosis not present

## 2020-08-24 DIAGNOSIS — J449 Chronic obstructive pulmonary disease, unspecified: Secondary | ICD-10-CM | POA: Diagnosis not present

## 2020-08-24 DIAGNOSIS — K2991 Gastroduodenitis, unspecified, with bleeding: Secondary | ICD-10-CM | POA: Diagnosis not present

## 2020-08-24 DIAGNOSIS — M659 Synovitis and tenosynovitis, unspecified: Secondary | ICD-10-CM | POA: Diagnosis not present

## 2020-08-24 DIAGNOSIS — M159 Polyosteoarthritis, unspecified: Secondary | ICD-10-CM | POA: Diagnosis not present

## 2020-08-24 DIAGNOSIS — R739 Hyperglycemia, unspecified: Secondary | ICD-10-CM | POA: Diagnosis not present

## 2020-08-24 DIAGNOSIS — E785 Hyperlipidemia, unspecified: Secondary | ICD-10-CM | POA: Diagnosis not present

## 2020-08-24 DIAGNOSIS — G4700 Insomnia, unspecified: Secondary | ICD-10-CM | POA: Diagnosis not present

## 2020-08-24 DIAGNOSIS — K2971 Gastritis, unspecified, with bleeding: Secondary | ICD-10-CM | POA: Diagnosis not present

## 2020-08-24 DIAGNOSIS — J301 Allergic rhinitis due to pollen: Secondary | ICD-10-CM | POA: Diagnosis not present

## 2020-09-14 DIAGNOSIS — K2971 Gastritis, unspecified, with bleeding: Secondary | ICD-10-CM | POA: Diagnosis not present

## 2020-09-14 DIAGNOSIS — J301 Allergic rhinitis due to pollen: Secondary | ICD-10-CM | POA: Diagnosis not present

## 2020-09-14 DIAGNOSIS — E785 Hyperlipidemia, unspecified: Secondary | ICD-10-CM | POA: Diagnosis not present

## 2020-09-14 DIAGNOSIS — G4700 Insomnia, unspecified: Secondary | ICD-10-CM | POA: Diagnosis not present

## 2020-09-14 DIAGNOSIS — M659 Synovitis and tenosynovitis, unspecified: Secondary | ICD-10-CM | POA: Diagnosis not present

## 2020-09-14 DIAGNOSIS — R739 Hyperglycemia, unspecified: Secondary | ICD-10-CM | POA: Diagnosis not present

## 2020-09-14 DIAGNOSIS — K2991 Gastroduodenitis, unspecified, with bleeding: Secondary | ICD-10-CM | POA: Diagnosis not present

## 2020-09-14 DIAGNOSIS — J449 Chronic obstructive pulmonary disease, unspecified: Secondary | ICD-10-CM | POA: Diagnosis not present

## 2020-09-14 DIAGNOSIS — M159 Polyosteoarthritis, unspecified: Secondary | ICD-10-CM | POA: Diagnosis not present

## 2020-09-20 IMAGING — RF DG ESOPHAGUS
8 of 11 series · 15 of 23 positions shown · non-contrast
Comparison: None.

CLINICAL DATA: Eren diverticulum on modified barium swallow.

EXAM:
ESOPHOGRAM/BARIUM SWALLOW
TECHNIQUE: Single contrast examination was performed using thin barium or water
soluble.
FLUOROSCOPY TIME:  Fluoroscopy Time:  1 minutes 42 seconds
Radiation Exposure Index (if provided by the fluoroscopic device):
16.7
Number of Acquired Spot Images: 6

[Series 1: cp_standard · 0.34mm/px · 3 of 56 frames shown (1 of 4)]
[frame 9/56]
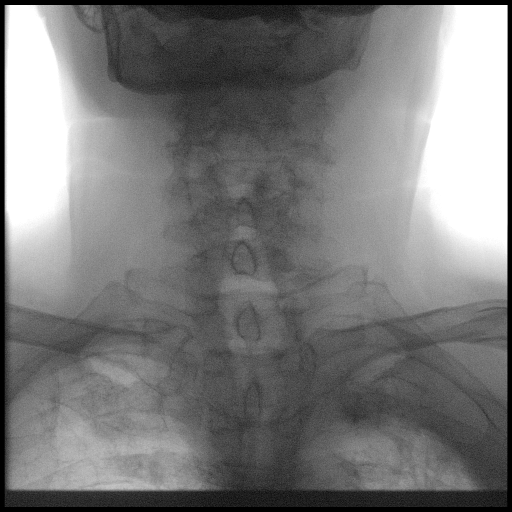
[frame 29/56]
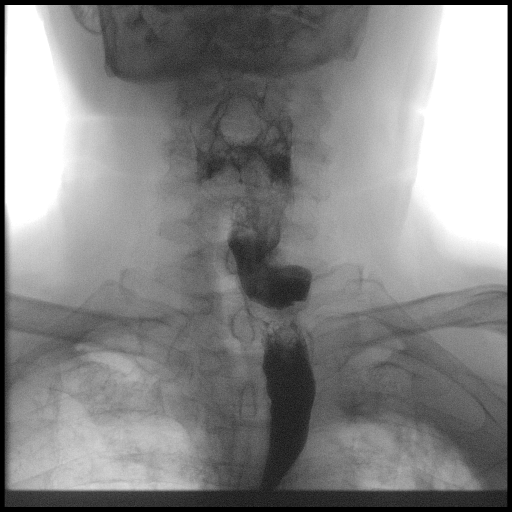
[frame 48/56]
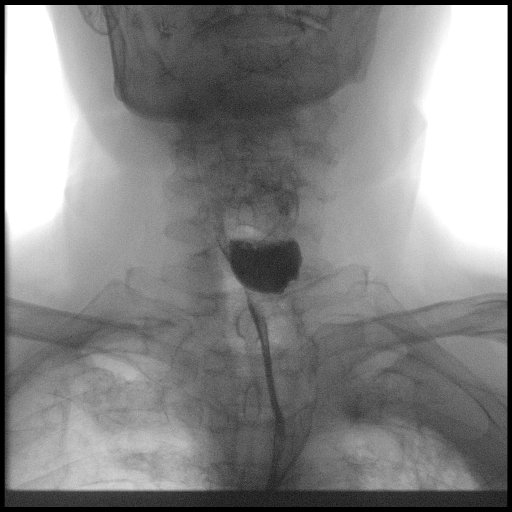

[Series 2: fluoro_barium 2fps_bw · 0.17mm/px · 2 of 3 frames shown (1 of 4)]
[frame 2/3]
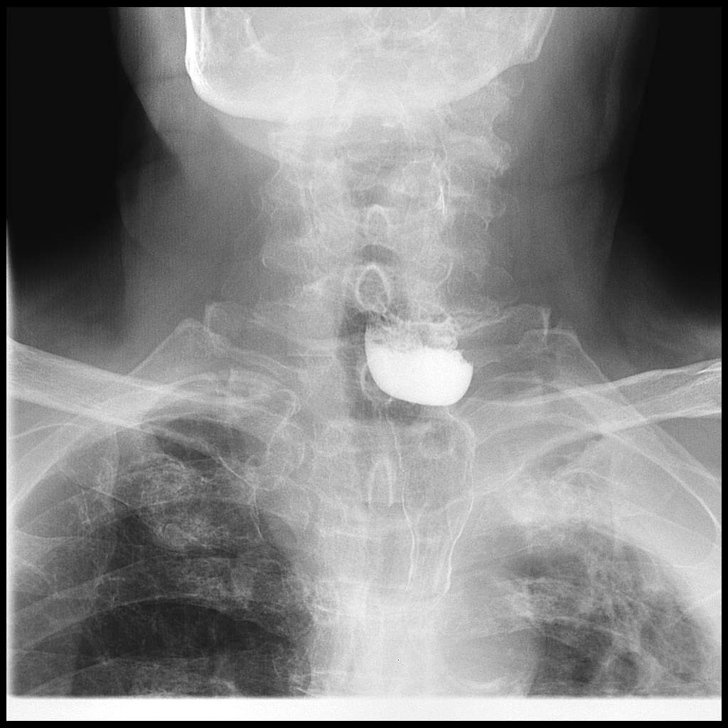
[frame 3/3]
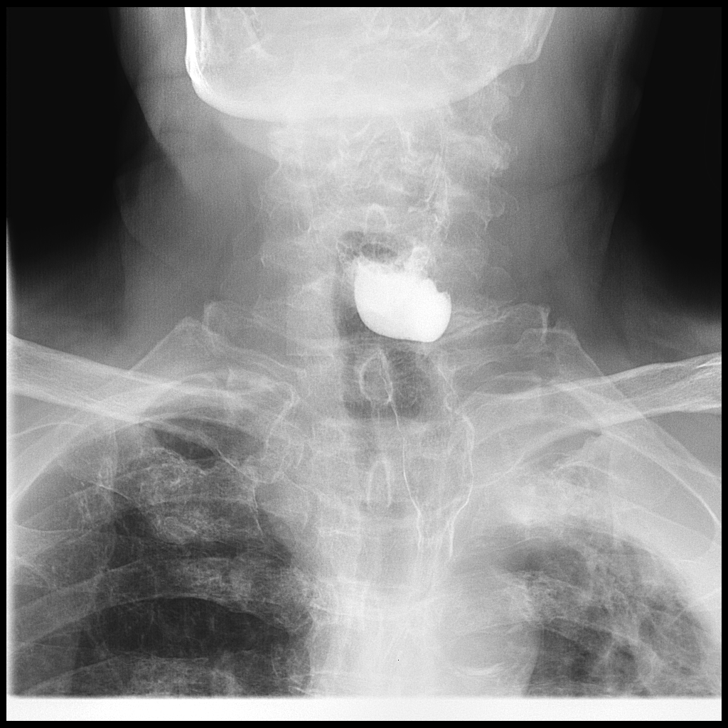

[Series 3: cp_standard · 0.34mm/px · 2 of 46 frames shown (2 of 4)]
[frame 24/46]
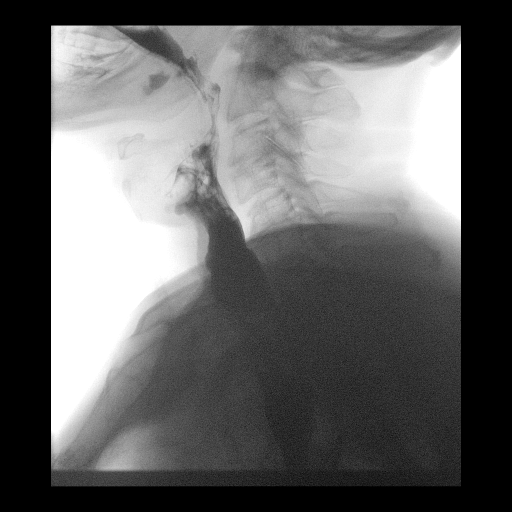
[frame 40/46]
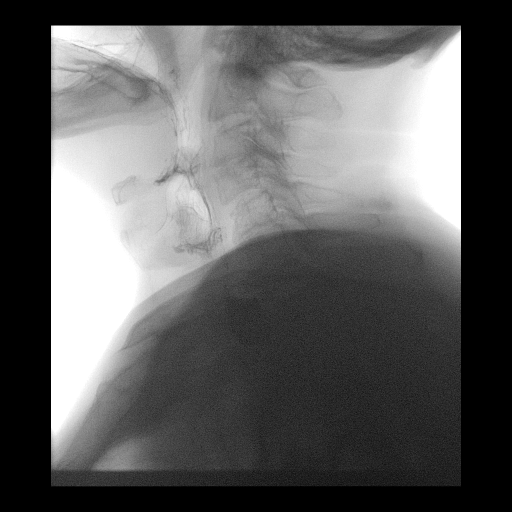

[Series 5: fluoro_barium 2fps_bw · 0.17mm/px · 1 of 1 slices shown (2 of 4)]
[im 1/1]
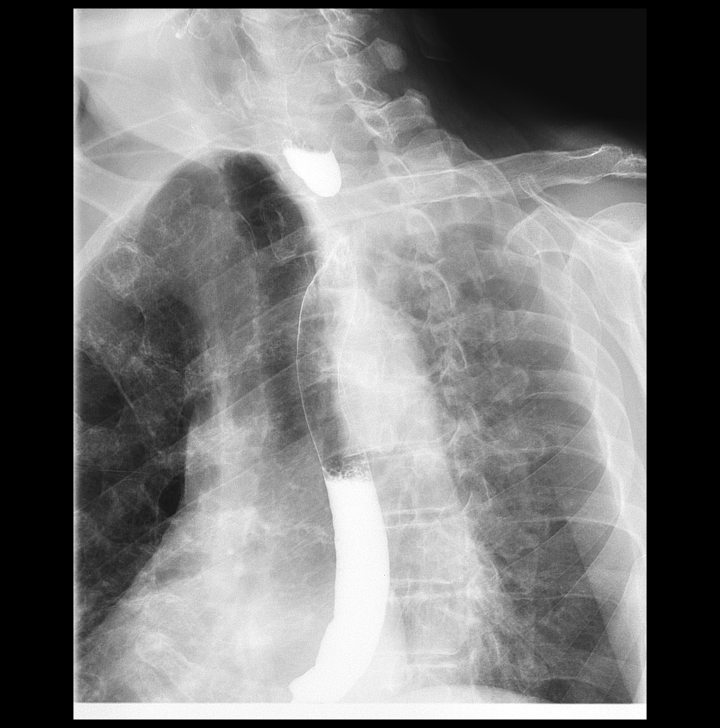

[Series 7: cp_standard · 0.51mm/px · 3 of 60 frames shown (3 of 4)]
[frame 10/60]
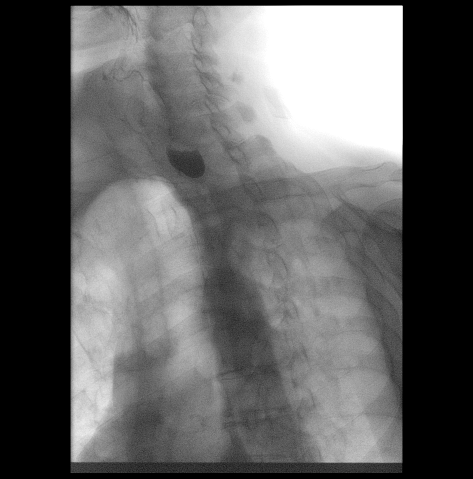
[frame 31/60]
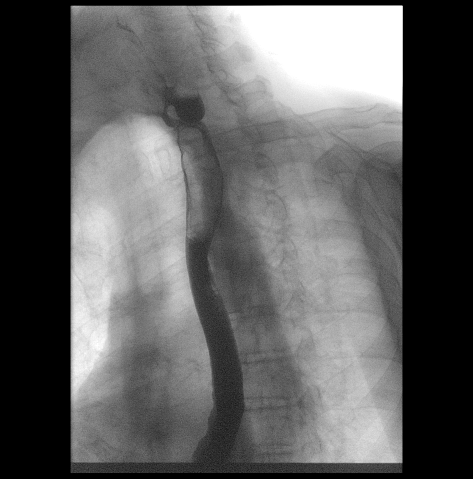
[frame 60/60]
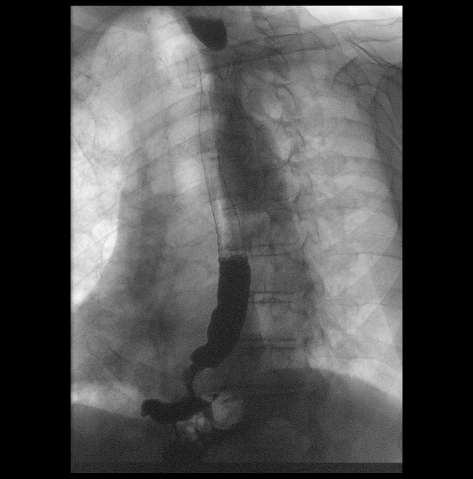

[Series 8: cp_standard · 0.34mm/px · 2 of 34 frames shown (4 of 4)]
[frame 6/34]
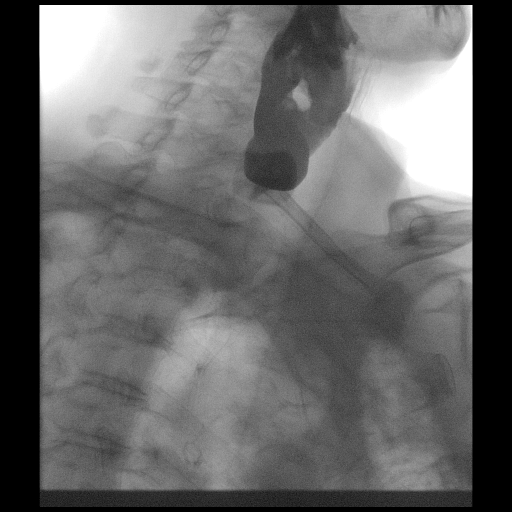
[frame 29/34]
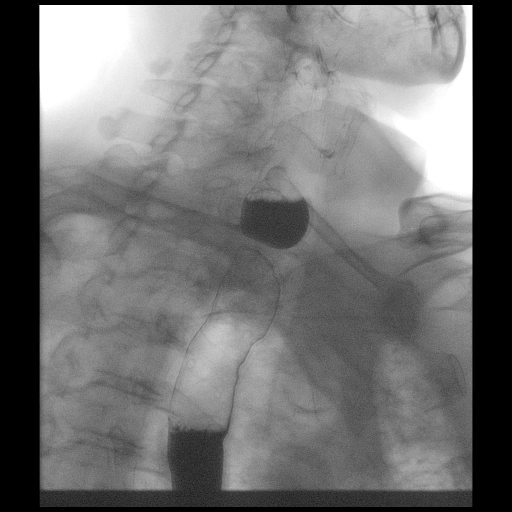

[Series 9: fluoro_barium 2fps_bw · 0.17mm/px · 1 of 1 slices shown (3 of 4)]
[im 1/1]
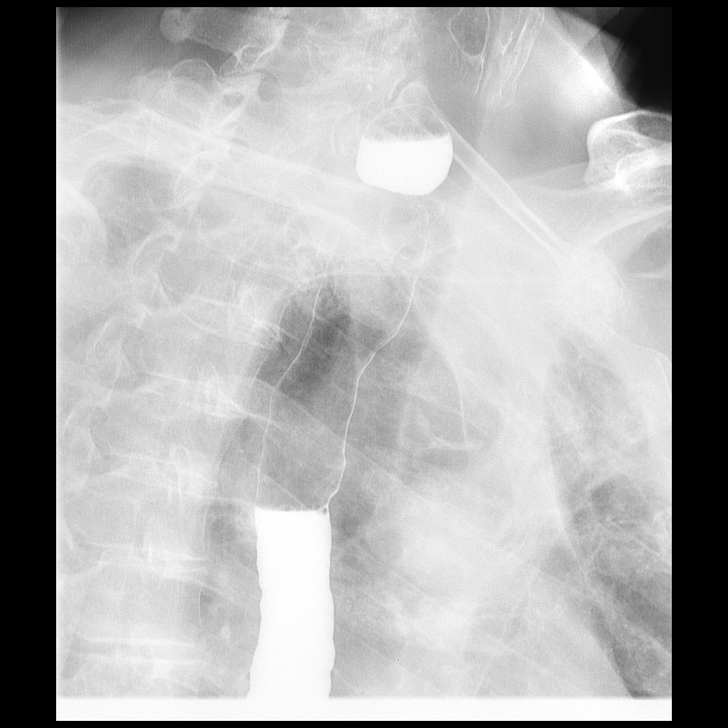

[Series 11: fluoro_barium 2fps_bw · 0.17mm/px · 1 of 1 slices shown (4 of 4)]
[im 1/1]
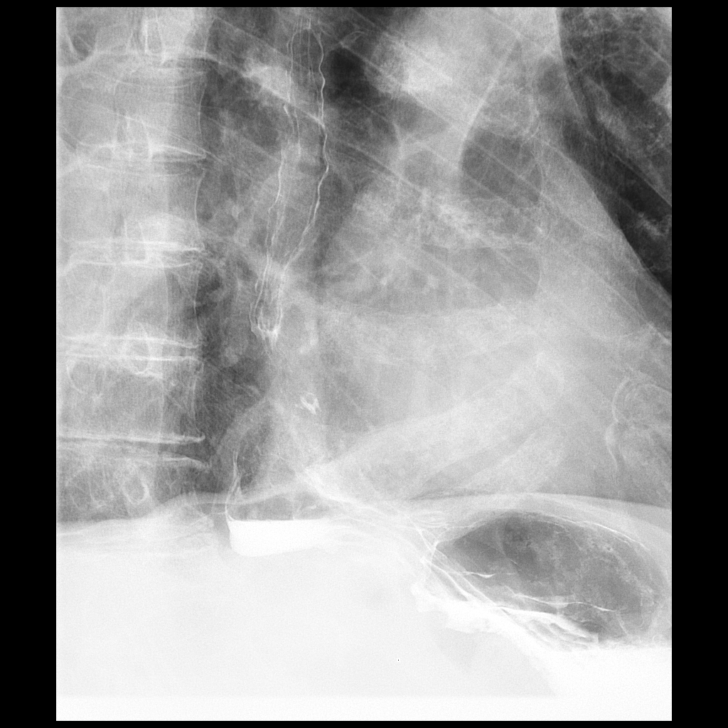

[15 of 23 positions shown; findings below may reference images not displayed]

FINDINGS: Rapid sequence evaluation of the cervical esophagus demonstrates a
large posterior diverticulum. The diverticulum measures upwards of
2.1 x 2.0 by 1.4 cm. The diverticulum has a approximately 0.7 cm
neck. Diverticulum collects the oral contrast.

More distal esophagus demonstrates no mucosal irregularity. Moderate
size hiatal hernia noted.
IMPRESSION: 1. Large Eren diverticulum.
2. Moderate hiatal hernia.

## 2020-10-12 DIAGNOSIS — K2971 Gastritis, unspecified, with bleeding: Secondary | ICD-10-CM | POA: Diagnosis not present

## 2020-10-12 DIAGNOSIS — M159 Polyosteoarthritis, unspecified: Secondary | ICD-10-CM | POA: Diagnosis not present

## 2020-10-12 DIAGNOSIS — J301 Allergic rhinitis due to pollen: Secondary | ICD-10-CM | POA: Diagnosis not present

## 2020-10-12 DIAGNOSIS — E785 Hyperlipidemia, unspecified: Secondary | ICD-10-CM | POA: Diagnosis not present

## 2020-10-12 DIAGNOSIS — J449 Chronic obstructive pulmonary disease, unspecified: Secondary | ICD-10-CM | POA: Diagnosis not present

## 2020-10-12 DIAGNOSIS — R739 Hyperglycemia, unspecified: Secondary | ICD-10-CM | POA: Diagnosis not present

## 2020-10-12 DIAGNOSIS — G4700 Insomnia, unspecified: Secondary | ICD-10-CM | POA: Diagnosis not present

## 2020-10-12 DIAGNOSIS — K2991 Gastroduodenitis, unspecified, with bleeding: Secondary | ICD-10-CM | POA: Diagnosis not present

## 2020-10-26 DIAGNOSIS — J449 Chronic obstructive pulmonary disease, unspecified: Secondary | ICD-10-CM | POA: Diagnosis not present

## 2020-10-26 DIAGNOSIS — K2991 Gastroduodenitis, unspecified, with bleeding: Secondary | ICD-10-CM | POA: Diagnosis not present

## 2020-10-26 DIAGNOSIS — K2971 Gastritis, unspecified, with bleeding: Secondary | ICD-10-CM | POA: Diagnosis not present

## 2020-10-26 DIAGNOSIS — E785 Hyperlipidemia, unspecified: Secondary | ICD-10-CM | POA: Diagnosis not present

## 2020-10-26 DIAGNOSIS — M159 Polyosteoarthritis, unspecified: Secondary | ICD-10-CM | POA: Diagnosis not present

## 2020-10-26 DIAGNOSIS — R739 Hyperglycemia, unspecified: Secondary | ICD-10-CM | POA: Diagnosis not present

## 2020-10-26 DIAGNOSIS — G4700 Insomnia, unspecified: Secondary | ICD-10-CM | POA: Diagnosis not present

## 2020-10-26 DIAGNOSIS — J301 Allergic rhinitis due to pollen: Secondary | ICD-10-CM | POA: Diagnosis not present

## 2020-11-09 DIAGNOSIS — R739 Hyperglycemia, unspecified: Secondary | ICD-10-CM | POA: Diagnosis not present

## 2020-11-09 DIAGNOSIS — G4700 Insomnia, unspecified: Secondary | ICD-10-CM | POA: Diagnosis not present

## 2020-11-09 DIAGNOSIS — E785 Hyperlipidemia, unspecified: Secondary | ICD-10-CM | POA: Diagnosis not present

## 2020-11-09 DIAGNOSIS — M159 Polyosteoarthritis, unspecified: Secondary | ICD-10-CM | POA: Diagnosis not present

## 2020-11-09 DIAGNOSIS — K2971 Gastritis, unspecified, with bleeding: Secondary | ICD-10-CM | POA: Diagnosis not present

## 2020-11-09 DIAGNOSIS — J449 Chronic obstructive pulmonary disease, unspecified: Secondary | ICD-10-CM | POA: Diagnosis not present

## 2020-11-09 DIAGNOSIS — K2991 Gastroduodenitis, unspecified, with bleeding: Secondary | ICD-10-CM | POA: Diagnosis not present

## 2020-12-08 DIAGNOSIS — G4700 Insomnia, unspecified: Secondary | ICD-10-CM | POA: Diagnosis not present

## 2020-12-08 DIAGNOSIS — M159 Polyosteoarthritis, unspecified: Secondary | ICD-10-CM | POA: Diagnosis not present

## 2020-12-08 DIAGNOSIS — E785 Hyperlipidemia, unspecified: Secondary | ICD-10-CM | POA: Diagnosis not present

## 2020-12-08 DIAGNOSIS — R739 Hyperglycemia, unspecified: Secondary | ICD-10-CM | POA: Diagnosis not present

## 2020-12-08 DIAGNOSIS — J449 Chronic obstructive pulmonary disease, unspecified: Secondary | ICD-10-CM | POA: Diagnosis not present

## 2020-12-08 DIAGNOSIS — K2971 Gastritis, unspecified, with bleeding: Secondary | ICD-10-CM | POA: Diagnosis not present

## 2020-12-08 DIAGNOSIS — K2991 Gastroduodenitis, unspecified, with bleeding: Secondary | ICD-10-CM | POA: Diagnosis not present

## 2020-12-08 DIAGNOSIS — K921 Melena: Secondary | ICD-10-CM | POA: Diagnosis not present

## 2020-12-22 DIAGNOSIS — J449 Chronic obstructive pulmonary disease, unspecified: Secondary | ICD-10-CM | POA: Diagnosis not present

## 2020-12-22 DIAGNOSIS — R739 Hyperglycemia, unspecified: Secondary | ICD-10-CM | POA: Diagnosis not present

## 2020-12-22 DIAGNOSIS — K921 Melena: Secondary | ICD-10-CM | POA: Diagnosis not present

## 2020-12-22 DIAGNOSIS — K589 Irritable bowel syndrome without diarrhea: Secondary | ICD-10-CM | POA: Diagnosis not present

## 2020-12-22 DIAGNOSIS — G4700 Insomnia, unspecified: Secondary | ICD-10-CM | POA: Diagnosis not present

## 2020-12-22 DIAGNOSIS — K2991 Gastroduodenitis, unspecified, with bleeding: Secondary | ICD-10-CM | POA: Diagnosis not present

## 2020-12-22 DIAGNOSIS — M159 Polyosteoarthritis, unspecified: Secondary | ICD-10-CM | POA: Diagnosis not present

## 2020-12-22 DIAGNOSIS — K2971 Gastritis, unspecified, with bleeding: Secondary | ICD-10-CM | POA: Diagnosis not present

## 2020-12-26 DIAGNOSIS — H179 Unspecified corneal scar and opacity: Secondary | ICD-10-CM | POA: Diagnosis not present

## 2020-12-28 DIAGNOSIS — K589 Irritable bowel syndrome without diarrhea: Secondary | ICD-10-CM | POA: Diagnosis not present

## 2020-12-28 DIAGNOSIS — K921 Melena: Secondary | ICD-10-CM | POA: Diagnosis not present

## 2020-12-28 DIAGNOSIS — E785 Hyperlipidemia, unspecified: Secondary | ICD-10-CM | POA: Diagnosis not present

## 2020-12-28 DIAGNOSIS — K2991 Gastroduodenitis, unspecified, with bleeding: Secondary | ICD-10-CM | POA: Diagnosis not present

## 2020-12-28 DIAGNOSIS — M159 Polyosteoarthritis, unspecified: Secondary | ICD-10-CM | POA: Diagnosis not present

## 2020-12-28 DIAGNOSIS — K2971 Gastritis, unspecified, with bleeding: Secondary | ICD-10-CM | POA: Diagnosis not present

## 2020-12-28 DIAGNOSIS — R739 Hyperglycemia, unspecified: Secondary | ICD-10-CM | POA: Diagnosis not present

## 2020-12-28 DIAGNOSIS — R110 Nausea: Secondary | ICD-10-CM | POA: Diagnosis not present

## 2020-12-28 DIAGNOSIS — G4700 Insomnia, unspecified: Secondary | ICD-10-CM | POA: Diagnosis not present

## 2020-12-28 DIAGNOSIS — J449 Chronic obstructive pulmonary disease, unspecified: Secondary | ICD-10-CM | POA: Diagnosis not present

## 2020-12-29 DIAGNOSIS — Z9181 History of falling: Secondary | ICD-10-CM | POA: Diagnosis not present

## 2020-12-29 DIAGNOSIS — E785 Hyperlipidemia, unspecified: Secondary | ICD-10-CM | POA: Diagnosis not present

## 2020-12-29 DIAGNOSIS — Z0000 Encounter for general adult medical examination without abnormal findings: Secondary | ICD-10-CM | POA: Diagnosis not present

## 2021-01-05 DIAGNOSIS — K2991 Gastroduodenitis, unspecified, with bleeding: Secondary | ICD-10-CM | POA: Diagnosis not present

## 2021-01-05 DIAGNOSIS — K2971 Gastritis, unspecified, with bleeding: Secondary | ICD-10-CM | POA: Diagnosis not present

## 2021-01-05 DIAGNOSIS — J449 Chronic obstructive pulmonary disease, unspecified: Secondary | ICD-10-CM | POA: Diagnosis not present

## 2021-01-05 DIAGNOSIS — G4700 Insomnia, unspecified: Secondary | ICD-10-CM | POA: Diagnosis not present

## 2021-01-05 DIAGNOSIS — R739 Hyperglycemia, unspecified: Secondary | ICD-10-CM | POA: Diagnosis not present

## 2021-01-05 DIAGNOSIS — E785 Hyperlipidemia, unspecified: Secondary | ICD-10-CM | POA: Diagnosis not present

## 2021-01-05 DIAGNOSIS — M159 Polyosteoarthritis, unspecified: Secondary | ICD-10-CM | POA: Diagnosis not present

## 2021-02-05 DIAGNOSIS — M159 Polyosteoarthritis, unspecified: Secondary | ICD-10-CM | POA: Diagnosis not present

## 2021-02-05 DIAGNOSIS — G4700 Insomnia, unspecified: Secondary | ICD-10-CM | POA: Diagnosis not present

## 2021-02-05 DIAGNOSIS — E785 Hyperlipidemia, unspecified: Secondary | ICD-10-CM | POA: Diagnosis not present

## 2021-02-05 DIAGNOSIS — K2971 Gastritis, unspecified, with bleeding: Secondary | ICD-10-CM | POA: Diagnosis not present

## 2021-02-05 DIAGNOSIS — K2991 Gastroduodenitis, unspecified, with bleeding: Secondary | ICD-10-CM | POA: Diagnosis not present

## 2021-02-05 DIAGNOSIS — Z23 Encounter for immunization: Secondary | ICD-10-CM | POA: Diagnosis not present

## 2021-02-05 DIAGNOSIS — R739 Hyperglycemia, unspecified: Secondary | ICD-10-CM | POA: Diagnosis not present

## 2021-02-05 DIAGNOSIS — K589 Irritable bowel syndrome without diarrhea: Secondary | ICD-10-CM | POA: Diagnosis not present

## 2021-02-05 DIAGNOSIS — J449 Chronic obstructive pulmonary disease, unspecified: Secondary | ICD-10-CM | POA: Diagnosis not present

## 2021-03-06 DIAGNOSIS — Z1211 Encounter for screening for malignant neoplasm of colon: Secondary | ICD-10-CM | POA: Diagnosis not present

## 2021-03-06 DIAGNOSIS — R1013 Epigastric pain: Secondary | ICD-10-CM | POA: Diagnosis not present

## 2021-03-12 DIAGNOSIS — N281 Cyst of kidney, acquired: Secondary | ICD-10-CM | POA: Diagnosis not present

## 2021-03-12 DIAGNOSIS — K8020 Calculus of gallbladder without cholecystitis without obstruction: Secondary | ICD-10-CM | POA: Diagnosis not present

## 2021-03-12 DIAGNOSIS — R1013 Epigastric pain: Secondary | ICD-10-CM | POA: Diagnosis not present

## 2021-03-20 DIAGNOSIS — K8010 Calculus of gallbladder with chronic cholecystitis without obstruction: Secondary | ICD-10-CM | POA: Diagnosis not present

## 2021-03-21 DIAGNOSIS — K2991 Gastroduodenitis, unspecified, with bleeding: Secondary | ICD-10-CM | POA: Diagnosis not present

## 2021-03-21 DIAGNOSIS — B9689 Other specified bacterial agents as the cause of diseases classified elsewhere: Secondary | ICD-10-CM | POA: Diagnosis not present

## 2021-03-21 DIAGNOSIS — R739 Hyperglycemia, unspecified: Secondary | ICD-10-CM | POA: Diagnosis not present

## 2021-03-21 DIAGNOSIS — G4700 Insomnia, unspecified: Secondary | ICD-10-CM | POA: Diagnosis not present

## 2021-03-21 DIAGNOSIS — J208 Acute bronchitis due to other specified organisms: Secondary | ICD-10-CM | POA: Diagnosis not present

## 2021-03-21 DIAGNOSIS — M159 Polyosteoarthritis, unspecified: Secondary | ICD-10-CM | POA: Diagnosis not present

## 2021-03-21 DIAGNOSIS — E785 Hyperlipidemia, unspecified: Secondary | ICD-10-CM | POA: Diagnosis not present

## 2021-03-21 DIAGNOSIS — K2971 Gastritis, unspecified, with bleeding: Secondary | ICD-10-CM | POA: Diagnosis not present

## 2021-03-21 DIAGNOSIS — K589 Irritable bowel syndrome without diarrhea: Secondary | ICD-10-CM | POA: Diagnosis not present

## 2021-03-21 DIAGNOSIS — J449 Chronic obstructive pulmonary disease, unspecified: Secondary | ICD-10-CM | POA: Diagnosis not present

## 2021-04-19 DIAGNOSIS — G4700 Insomnia, unspecified: Secondary | ICD-10-CM | POA: Diagnosis not present

## 2021-04-19 DIAGNOSIS — E785 Hyperlipidemia, unspecified: Secondary | ICD-10-CM | POA: Diagnosis not present

## 2021-04-19 DIAGNOSIS — K2991 Gastroduodenitis, unspecified, with bleeding: Secondary | ICD-10-CM | POA: Diagnosis not present

## 2021-04-19 DIAGNOSIS — K2971 Gastritis, unspecified, with bleeding: Secondary | ICD-10-CM | POA: Diagnosis not present

## 2021-04-19 DIAGNOSIS — M159 Polyosteoarthritis, unspecified: Secondary | ICD-10-CM | POA: Diagnosis not present

## 2021-04-19 DIAGNOSIS — R739 Hyperglycemia, unspecified: Secondary | ICD-10-CM | POA: Diagnosis not present

## 2021-04-19 DIAGNOSIS — K589 Irritable bowel syndrome without diarrhea: Secondary | ICD-10-CM | POA: Diagnosis not present

## 2021-04-19 DIAGNOSIS — M25511 Pain in right shoulder: Secondary | ICD-10-CM | POA: Diagnosis not present

## 2021-04-19 DIAGNOSIS — J449 Chronic obstructive pulmonary disease, unspecified: Secondary | ICD-10-CM | POA: Diagnosis not present

## 2021-04-30 DIAGNOSIS — J449 Chronic obstructive pulmonary disease, unspecified: Secondary | ICD-10-CM | POA: Diagnosis not present

## 2021-04-30 DIAGNOSIS — M159 Polyosteoarthritis, unspecified: Secondary | ICD-10-CM | POA: Diagnosis not present

## 2021-04-30 DIAGNOSIS — G4700 Insomnia, unspecified: Secondary | ICD-10-CM | POA: Diagnosis not present

## 2021-04-30 DIAGNOSIS — K589 Irritable bowel syndrome without diarrhea: Secondary | ICD-10-CM | POA: Diagnosis not present

## 2021-04-30 DIAGNOSIS — E785 Hyperlipidemia, unspecified: Secondary | ICD-10-CM | POA: Diagnosis not present

## 2021-04-30 DIAGNOSIS — K2991 Gastroduodenitis, unspecified, with bleeding: Secondary | ICD-10-CM | POA: Diagnosis not present

## 2021-04-30 DIAGNOSIS — R739 Hyperglycemia, unspecified: Secondary | ICD-10-CM | POA: Diagnosis not present

## 2021-04-30 DIAGNOSIS — K2971 Gastritis, unspecified, with bleeding: Secondary | ICD-10-CM | POA: Diagnosis not present

## 2021-05-17 DIAGNOSIS — R739 Hyperglycemia, unspecified: Secondary | ICD-10-CM | POA: Diagnosis not present

## 2021-05-17 DIAGNOSIS — K589 Irritable bowel syndrome without diarrhea: Secondary | ICD-10-CM | POA: Diagnosis not present

## 2021-05-17 DIAGNOSIS — E785 Hyperlipidemia, unspecified: Secondary | ICD-10-CM | POA: Diagnosis not present

## 2021-05-17 DIAGNOSIS — G4700 Insomnia, unspecified: Secondary | ICD-10-CM | POA: Diagnosis not present

## 2021-05-17 DIAGNOSIS — M159 Polyosteoarthritis, unspecified: Secondary | ICD-10-CM | POA: Diagnosis not present

## 2021-05-17 DIAGNOSIS — K2971 Gastritis, unspecified, with bleeding: Secondary | ICD-10-CM | POA: Diagnosis not present

## 2021-05-17 DIAGNOSIS — K2991 Gastroduodenitis, unspecified, with bleeding: Secondary | ICD-10-CM | POA: Diagnosis not present

## 2021-05-17 DIAGNOSIS — J449 Chronic obstructive pulmonary disease, unspecified: Secondary | ICD-10-CM | POA: Diagnosis not present

## 2021-06-14 DIAGNOSIS — E785 Hyperlipidemia, unspecified: Secondary | ICD-10-CM | POA: Diagnosis not present

## 2021-06-14 DIAGNOSIS — J449 Chronic obstructive pulmonary disease, unspecified: Secondary | ICD-10-CM | POA: Diagnosis not present

## 2021-06-14 DIAGNOSIS — D649 Anemia, unspecified: Secondary | ICD-10-CM | POA: Diagnosis not present

## 2021-06-14 DIAGNOSIS — R739 Hyperglycemia, unspecified: Secondary | ICD-10-CM | POA: Diagnosis not present

## 2021-06-14 DIAGNOSIS — K2991 Gastroduodenitis, unspecified, with bleeding: Secondary | ICD-10-CM | POA: Diagnosis not present

## 2021-06-14 DIAGNOSIS — K589 Irritable bowel syndrome without diarrhea: Secondary | ICD-10-CM | POA: Diagnosis not present

## 2021-06-14 DIAGNOSIS — M159 Polyosteoarthritis, unspecified: Secondary | ICD-10-CM | POA: Diagnosis not present

## 2021-06-14 DIAGNOSIS — G4700 Insomnia, unspecified: Secondary | ICD-10-CM | POA: Diagnosis not present

## 2021-06-14 DIAGNOSIS — K2971 Gastritis, unspecified, with bleeding: Secondary | ICD-10-CM | POA: Diagnosis not present

## 2021-07-12 DIAGNOSIS — M159 Polyosteoarthritis, unspecified: Secondary | ICD-10-CM | POA: Diagnosis not present

## 2021-07-12 DIAGNOSIS — D649 Anemia, unspecified: Secondary | ICD-10-CM | POA: Diagnosis not present

## 2021-07-12 DIAGNOSIS — R739 Hyperglycemia, unspecified: Secondary | ICD-10-CM | POA: Diagnosis not present

## 2021-07-12 DIAGNOSIS — K2991 Gastroduodenitis, unspecified, with bleeding: Secondary | ICD-10-CM | POA: Diagnosis not present

## 2021-07-12 DIAGNOSIS — G4700 Insomnia, unspecified: Secondary | ICD-10-CM | POA: Diagnosis not present

## 2021-07-12 DIAGNOSIS — J449 Chronic obstructive pulmonary disease, unspecified: Secondary | ICD-10-CM | POA: Diagnosis not present

## 2021-07-12 DIAGNOSIS — K2971 Gastritis, unspecified, with bleeding: Secondary | ICD-10-CM | POA: Diagnosis not present

## 2021-07-12 DIAGNOSIS — K589 Irritable bowel syndrome without diarrhea: Secondary | ICD-10-CM | POA: Diagnosis not present

## 2021-07-12 DIAGNOSIS — E785 Hyperlipidemia, unspecified: Secondary | ICD-10-CM | POA: Diagnosis not present

## 2021-08-13 DIAGNOSIS — K2971 Gastritis, unspecified, with bleeding: Secondary | ICD-10-CM | POA: Diagnosis not present

## 2021-08-13 DIAGNOSIS — M159 Polyosteoarthritis, unspecified: Secondary | ICD-10-CM | POA: Diagnosis not present

## 2021-08-13 DIAGNOSIS — E785 Hyperlipidemia, unspecified: Secondary | ICD-10-CM | POA: Diagnosis not present

## 2021-08-13 DIAGNOSIS — G4700 Insomnia, unspecified: Secondary | ICD-10-CM | POA: Diagnosis not present

## 2021-08-13 DIAGNOSIS — D649 Anemia, unspecified: Secondary | ICD-10-CM | POA: Diagnosis not present

## 2021-08-13 DIAGNOSIS — J449 Chronic obstructive pulmonary disease, unspecified: Secondary | ICD-10-CM | POA: Diagnosis not present

## 2021-08-13 DIAGNOSIS — K2991 Gastroduodenitis, unspecified, with bleeding: Secondary | ICD-10-CM | POA: Diagnosis not present

## 2021-08-13 DIAGNOSIS — R739 Hyperglycemia, unspecified: Secondary | ICD-10-CM | POA: Diagnosis not present

## 2021-08-13 DIAGNOSIS — K589 Irritable bowel syndrome without diarrhea: Secondary | ICD-10-CM | POA: Diagnosis not present

## 2021-09-11 DIAGNOSIS — E785 Hyperlipidemia, unspecified: Secondary | ICD-10-CM | POA: Diagnosis not present

## 2021-09-11 DIAGNOSIS — D649 Anemia, unspecified: Secondary | ICD-10-CM | POA: Diagnosis not present

## 2021-09-11 DIAGNOSIS — K589 Irritable bowel syndrome without diarrhea: Secondary | ICD-10-CM | POA: Diagnosis not present

## 2021-09-11 DIAGNOSIS — K2991 Gastroduodenitis, unspecified, with bleeding: Secondary | ICD-10-CM | POA: Diagnosis not present

## 2021-09-11 DIAGNOSIS — K2971 Gastritis, unspecified, with bleeding: Secondary | ICD-10-CM | POA: Diagnosis not present

## 2021-09-11 DIAGNOSIS — M159 Polyosteoarthritis, unspecified: Secondary | ICD-10-CM | POA: Diagnosis not present

## 2021-09-11 DIAGNOSIS — J449 Chronic obstructive pulmonary disease, unspecified: Secondary | ICD-10-CM | POA: Diagnosis not present

## 2021-09-11 DIAGNOSIS — Z139 Encounter for screening, unspecified: Secondary | ICD-10-CM | POA: Diagnosis not present

## 2021-09-11 DIAGNOSIS — R739 Hyperglycemia, unspecified: Secondary | ICD-10-CM | POA: Diagnosis not present

## 2021-09-11 DIAGNOSIS — G4700 Insomnia, unspecified: Secondary | ICD-10-CM | POA: Diagnosis not present

## 2021-10-09 DIAGNOSIS — D649 Anemia, unspecified: Secondary | ICD-10-CM | POA: Diagnosis not present

## 2021-10-09 DIAGNOSIS — E785 Hyperlipidemia, unspecified: Secondary | ICD-10-CM | POA: Diagnosis not present

## 2021-10-09 DIAGNOSIS — G4700 Insomnia, unspecified: Secondary | ICD-10-CM | POA: Diagnosis not present

## 2021-10-09 DIAGNOSIS — K589 Irritable bowel syndrome without diarrhea: Secondary | ICD-10-CM | POA: Diagnosis not present

## 2021-10-09 DIAGNOSIS — M159 Polyosteoarthritis, unspecified: Secondary | ICD-10-CM | POA: Diagnosis not present

## 2021-10-09 DIAGNOSIS — K2991 Gastroduodenitis, unspecified, with bleeding: Secondary | ICD-10-CM | POA: Diagnosis not present

## 2021-10-09 DIAGNOSIS — R7303 Prediabetes: Secondary | ICD-10-CM | POA: Diagnosis not present

## 2021-10-09 DIAGNOSIS — J449 Chronic obstructive pulmonary disease, unspecified: Secondary | ICD-10-CM | POA: Diagnosis not present

## 2021-10-09 DIAGNOSIS — K2971 Gastritis, unspecified, with bleeding: Secondary | ICD-10-CM | POA: Diagnosis not present

## 2021-10-09 DIAGNOSIS — M659 Synovitis and tenosynovitis, unspecified: Secondary | ICD-10-CM | POA: Diagnosis not present

## 2021-10-17 DIAGNOSIS — J449 Chronic obstructive pulmonary disease, unspecified: Secondary | ICD-10-CM | POA: Diagnosis not present

## 2021-10-17 DIAGNOSIS — M25511 Pain in right shoulder: Secondary | ICD-10-CM | POA: Diagnosis not present

## 2021-10-17 DIAGNOSIS — K589 Irritable bowel syndrome without diarrhea: Secondary | ICD-10-CM | POA: Diagnosis not present

## 2021-10-17 DIAGNOSIS — M159 Polyosteoarthritis, unspecified: Secondary | ICD-10-CM | POA: Diagnosis not present

## 2021-10-17 DIAGNOSIS — G4700 Insomnia, unspecified: Secondary | ICD-10-CM | POA: Diagnosis not present

## 2021-10-17 DIAGNOSIS — R7303 Prediabetes: Secondary | ICD-10-CM | POA: Diagnosis not present

## 2021-10-17 DIAGNOSIS — K2971 Gastritis, unspecified, with bleeding: Secondary | ICD-10-CM | POA: Diagnosis not present

## 2021-10-17 DIAGNOSIS — E785 Hyperlipidemia, unspecified: Secondary | ICD-10-CM | POA: Diagnosis not present

## 2021-10-17 DIAGNOSIS — K2991 Gastroduodenitis, unspecified, with bleeding: Secondary | ICD-10-CM | POA: Diagnosis not present

## 2021-11-06 DIAGNOSIS — E785 Hyperlipidemia, unspecified: Secondary | ICD-10-CM | POA: Diagnosis not present

## 2021-11-06 DIAGNOSIS — J449 Chronic obstructive pulmonary disease, unspecified: Secondary | ICD-10-CM | POA: Diagnosis not present

## 2021-11-06 DIAGNOSIS — D649 Anemia, unspecified: Secondary | ICD-10-CM | POA: Diagnosis not present

## 2021-11-06 DIAGNOSIS — G4700 Insomnia, unspecified: Secondary | ICD-10-CM | POA: Diagnosis not present

## 2021-11-06 DIAGNOSIS — K589 Irritable bowel syndrome without diarrhea: Secondary | ICD-10-CM | POA: Diagnosis not present

## 2021-11-06 DIAGNOSIS — M25511 Pain in right shoulder: Secondary | ICD-10-CM | POA: Diagnosis not present

## 2021-12-01 DIAGNOSIS — I10 Essential (primary) hypertension: Secondary | ICD-10-CM | POA: Diagnosis not present

## 2021-12-01 DIAGNOSIS — J45909 Unspecified asthma, uncomplicated: Secondary | ICD-10-CM | POA: Diagnosis not present

## 2021-12-01 DIAGNOSIS — J449 Chronic obstructive pulmonary disease, unspecified: Secondary | ICD-10-CM | POA: Diagnosis not present

## 2021-12-01 DIAGNOSIS — M25512 Pain in left shoulder: Secondary | ICD-10-CM | POA: Diagnosis not present

## 2021-12-01 DIAGNOSIS — R58 Hemorrhage, not elsewhere classified: Secondary | ICD-10-CM | POA: Diagnosis not present

## 2021-12-01 DIAGNOSIS — Z79899 Other long term (current) drug therapy: Secondary | ICD-10-CM | POA: Diagnosis not present

## 2021-12-01 DIAGNOSIS — G4700 Insomnia, unspecified: Secondary | ICD-10-CM | POA: Diagnosis not present

## 2021-12-01 DIAGNOSIS — S81022A Laceration with foreign body, left knee, initial encounter: Secondary | ICD-10-CM | POA: Diagnosis not present

## 2021-12-01 DIAGNOSIS — K219 Gastro-esophageal reflux disease without esophagitis: Secondary | ICD-10-CM | POA: Diagnosis not present

## 2021-12-01 DIAGNOSIS — S42212A Unspecified displaced fracture of surgical neck of left humerus, initial encounter for closed fracture: Secondary | ICD-10-CM | POA: Diagnosis not present

## 2021-12-01 DIAGNOSIS — S2242XA Multiple fractures of ribs, left side, initial encounter for closed fracture: Secondary | ICD-10-CM | POA: Diagnosis not present

## 2021-12-01 DIAGNOSIS — S4992XA Unspecified injury of left shoulder and upper arm, initial encounter: Secondary | ICD-10-CM | POA: Diagnosis not present

## 2021-12-01 DIAGNOSIS — S42302A Unspecified fracture of shaft of humerus, left arm, initial encounter for closed fracture: Secondary | ICD-10-CM | POA: Diagnosis not present

## 2021-12-01 DIAGNOSIS — G8929 Other chronic pain: Secondary | ICD-10-CM | POA: Diagnosis not present

## 2021-12-01 DIAGNOSIS — J984 Other disorders of lung: Secondary | ICD-10-CM | POA: Diagnosis not present

## 2021-12-01 DIAGNOSIS — M795 Residual foreign body in soft tissue: Secondary | ICD-10-CM | POA: Diagnosis not present

## 2021-12-01 DIAGNOSIS — S42352A Displaced comminuted fracture of shaft of humerus, left arm, initial encounter for closed fracture: Secondary | ICD-10-CM | POA: Diagnosis not present

## 2021-12-01 DIAGNOSIS — G8918 Other acute postprocedural pain: Secondary | ICD-10-CM | POA: Diagnosis not present

## 2021-12-01 DIAGNOSIS — S0990XA Unspecified injury of head, initial encounter: Secondary | ICD-10-CM | POA: Diagnosis not present

## 2021-12-01 DIAGNOSIS — S42252A Displaced fracture of greater tuberosity of left humerus, initial encounter for closed fracture: Secondary | ICD-10-CM | POA: Diagnosis not present

## 2021-12-01 DIAGNOSIS — Z743 Need for continuous supervision: Secondary | ICD-10-CM | POA: Diagnosis not present

## 2021-12-01 DIAGNOSIS — M1990 Unspecified osteoarthritis, unspecified site: Secondary | ICD-10-CM | POA: Diagnosis not present

## 2021-12-01 DIAGNOSIS — W19XXXA Unspecified fall, initial encounter: Secondary | ICD-10-CM | POA: Diagnosis not present

## 2021-12-01 DIAGNOSIS — Z043 Encounter for examination and observation following other accident: Secondary | ICD-10-CM | POA: Diagnosis not present

## 2021-12-01 DIAGNOSIS — S42002A Fracture of unspecified part of left clavicle, initial encounter for closed fracture: Secondary | ICD-10-CM | POA: Diagnosis not present

## 2021-12-01 DIAGNOSIS — Z87891 Personal history of nicotine dependence: Secondary | ICD-10-CM | POA: Diagnosis not present

## 2021-12-01 DIAGNOSIS — S81012A Laceration without foreign body, left knee, initial encounter: Secondary | ICD-10-CM | POA: Diagnosis not present

## 2021-12-12 DIAGNOSIS — R5382 Chronic fatigue, unspecified: Secondary | ICD-10-CM | POA: Diagnosis not present

## 2021-12-12 DIAGNOSIS — J989 Respiratory disorder, unspecified: Secondary | ICD-10-CM | POA: Diagnosis not present

## 2021-12-12 DIAGNOSIS — K589 Irritable bowel syndrome without diarrhea: Secondary | ICD-10-CM | POA: Diagnosis not present

## 2021-12-12 DIAGNOSIS — E785 Hyperlipidemia, unspecified: Secondary | ICD-10-CM | POA: Diagnosis not present

## 2021-12-12 DIAGNOSIS — R7303 Prediabetes: Secondary | ICD-10-CM | POA: Diagnosis not present

## 2021-12-12 DIAGNOSIS — K2991 Gastroduodenitis, unspecified, with bleeding: Secondary | ICD-10-CM | POA: Diagnosis not present

## 2021-12-12 DIAGNOSIS — D649 Anemia, unspecified: Secondary | ICD-10-CM | POA: Diagnosis not present

## 2021-12-12 DIAGNOSIS — K2971 Gastritis, unspecified, with bleeding: Secondary | ICD-10-CM | POA: Diagnosis not present

## 2021-12-12 DIAGNOSIS — M159 Polyosteoarthritis, unspecified: Secondary | ICD-10-CM | POA: Diagnosis not present

## 2021-12-12 DIAGNOSIS — G4700 Insomnia, unspecified: Secondary | ICD-10-CM | POA: Diagnosis not present

## 2022-01-09 DIAGNOSIS — D649 Anemia, unspecified: Secondary | ICD-10-CM | POA: Diagnosis not present

## 2022-01-09 DIAGNOSIS — M159 Polyosteoarthritis, unspecified: Secondary | ICD-10-CM | POA: Diagnosis not present

## 2022-01-09 DIAGNOSIS — J989 Respiratory disorder, unspecified: Secondary | ICD-10-CM | POA: Diagnosis not present

## 2022-01-09 DIAGNOSIS — K589 Irritable bowel syndrome without diarrhea: Secondary | ICD-10-CM | POA: Diagnosis not present

## 2022-01-09 DIAGNOSIS — K2991 Gastroduodenitis, unspecified, with bleeding: Secondary | ICD-10-CM | POA: Diagnosis not present

## 2022-01-09 DIAGNOSIS — K2971 Gastritis, unspecified, with bleeding: Secondary | ICD-10-CM | POA: Diagnosis not present

## 2022-01-09 DIAGNOSIS — L03116 Cellulitis of left lower limb: Secondary | ICD-10-CM | POA: Diagnosis not present

## 2022-01-09 DIAGNOSIS — R7303 Prediabetes: Secondary | ICD-10-CM | POA: Diagnosis not present

## 2022-01-09 DIAGNOSIS — E785 Hyperlipidemia, unspecified: Secondary | ICD-10-CM | POA: Diagnosis not present

## 2022-01-09 DIAGNOSIS — G4700 Insomnia, unspecified: Secondary | ICD-10-CM | POA: Diagnosis not present

## 2022-10-08 DIAGNOSIS — J309 Allergic rhinitis, unspecified: Secondary | ICD-10-CM | POA: Diagnosis not present

## 2022-10-08 DIAGNOSIS — K2991 Gastroduodenitis, unspecified, with bleeding: Secondary | ICD-10-CM | POA: Diagnosis not present

## 2022-10-08 DIAGNOSIS — K589 Irritable bowel syndrome without diarrhea: Secondary | ICD-10-CM | POA: Diagnosis not present

## 2022-10-08 DIAGNOSIS — D649 Anemia, unspecified: Secondary | ICD-10-CM | POA: Diagnosis not present

## 2022-10-08 DIAGNOSIS — M159 Polyosteoarthritis, unspecified: Secondary | ICD-10-CM | POA: Diagnosis not present

## 2022-10-08 DIAGNOSIS — G4700 Insomnia, unspecified: Secondary | ICD-10-CM | POA: Diagnosis not present

## 2022-10-08 DIAGNOSIS — E785 Hyperlipidemia, unspecified: Secondary | ICD-10-CM | POA: Diagnosis not present

## 2022-10-08 DIAGNOSIS — R7303 Prediabetes: Secondary | ICD-10-CM | POA: Diagnosis not present

## 2022-10-08 DIAGNOSIS — K2971 Gastritis, unspecified, with bleeding: Secondary | ICD-10-CM | POA: Diagnosis not present

## 2022-10-08 DIAGNOSIS — J989 Respiratory disorder, unspecified: Secondary | ICD-10-CM | POA: Diagnosis not present

## 2022-10-13 DIAGNOSIS — S0990XA Unspecified injury of head, initial encounter: Secondary | ICD-10-CM | POA: Diagnosis not present

## 2022-10-13 DIAGNOSIS — S0181XA Laceration without foreign body of other part of head, initial encounter: Secondary | ICD-10-CM | POA: Diagnosis not present

## 2022-10-13 DIAGNOSIS — W19XXXA Unspecified fall, initial encounter: Secondary | ICD-10-CM | POA: Diagnosis not present

## 2022-11-05 DIAGNOSIS — J309 Allergic rhinitis, unspecified: Secondary | ICD-10-CM | POA: Diagnosis not present

## 2022-11-05 DIAGNOSIS — K2991 Gastroduodenitis, unspecified, with bleeding: Secondary | ICD-10-CM | POA: Diagnosis not present

## 2022-11-05 DIAGNOSIS — M25511 Pain in right shoulder: Secondary | ICD-10-CM | POA: Diagnosis not present

## 2022-11-05 DIAGNOSIS — R7303 Prediabetes: Secondary | ICD-10-CM | POA: Diagnosis not present

## 2022-11-05 DIAGNOSIS — E785 Hyperlipidemia, unspecified: Secondary | ICD-10-CM | POA: Diagnosis not present

## 2022-11-05 DIAGNOSIS — G4700 Insomnia, unspecified: Secondary | ICD-10-CM | POA: Diagnosis not present

## 2022-11-05 DIAGNOSIS — J989 Respiratory disorder, unspecified: Secondary | ICD-10-CM | POA: Diagnosis not present

## 2022-11-05 DIAGNOSIS — K2971 Gastritis, unspecified, with bleeding: Secondary | ICD-10-CM | POA: Diagnosis not present

## 2022-11-05 DIAGNOSIS — K589 Irritable bowel syndrome without diarrhea: Secondary | ICD-10-CM | POA: Diagnosis not present

## 2022-11-05 DIAGNOSIS — M159 Polyosteoarthritis, unspecified: Secondary | ICD-10-CM | POA: Diagnosis not present

## 2022-11-05 DIAGNOSIS — D649 Anemia, unspecified: Secondary | ICD-10-CM | POA: Diagnosis not present

## 2022-11-19 DIAGNOSIS — J309 Allergic rhinitis, unspecified: Secondary | ICD-10-CM | POA: Diagnosis not present

## 2022-11-19 DIAGNOSIS — K2971 Gastritis, unspecified, with bleeding: Secondary | ICD-10-CM | POA: Diagnosis not present

## 2022-11-19 DIAGNOSIS — K2991 Gastroduodenitis, unspecified, with bleeding: Secondary | ICD-10-CM | POA: Diagnosis not present

## 2022-11-19 DIAGNOSIS — G4700 Insomnia, unspecified: Secondary | ICD-10-CM | POA: Diagnosis not present

## 2022-11-19 DIAGNOSIS — J989 Respiratory disorder, unspecified: Secondary | ICD-10-CM | POA: Diagnosis not present

## 2022-11-19 DIAGNOSIS — R7303 Prediabetes: Secondary | ICD-10-CM | POA: Diagnosis not present

## 2022-11-19 DIAGNOSIS — D649 Anemia, unspecified: Secondary | ICD-10-CM | POA: Diagnosis not present

## 2022-11-19 DIAGNOSIS — E785 Hyperlipidemia, unspecified: Secondary | ICD-10-CM | POA: Diagnosis not present

## 2022-11-19 DIAGNOSIS — K589 Irritable bowel syndrome without diarrhea: Secondary | ICD-10-CM | POA: Diagnosis not present

## 2022-11-19 DIAGNOSIS — M159 Polyosteoarthritis, unspecified: Secondary | ICD-10-CM | POA: Diagnosis not present

## 2022-12-03 DIAGNOSIS — E785 Hyperlipidemia, unspecified: Secondary | ICD-10-CM | POA: Diagnosis not present

## 2022-12-03 DIAGNOSIS — J989 Respiratory disorder, unspecified: Secondary | ICD-10-CM | POA: Diagnosis not present

## 2022-12-03 DIAGNOSIS — K2971 Gastritis, unspecified, with bleeding: Secondary | ICD-10-CM | POA: Diagnosis not present

## 2022-12-03 DIAGNOSIS — G4700 Insomnia, unspecified: Secondary | ICD-10-CM | POA: Diagnosis not present

## 2022-12-03 DIAGNOSIS — K2991 Gastroduodenitis, unspecified, with bleeding: Secondary | ICD-10-CM | POA: Diagnosis not present

## 2022-12-03 DIAGNOSIS — J309 Allergic rhinitis, unspecified: Secondary | ICD-10-CM | POA: Diagnosis not present

## 2022-12-03 DIAGNOSIS — K589 Irritable bowel syndrome without diarrhea: Secondary | ICD-10-CM | POA: Diagnosis not present

## 2022-12-03 DIAGNOSIS — R7303 Prediabetes: Secondary | ICD-10-CM | POA: Diagnosis not present

## 2022-12-03 DIAGNOSIS — D649 Anemia, unspecified: Secondary | ICD-10-CM | POA: Diagnosis not present

## 2022-12-03 DIAGNOSIS — M159 Polyosteoarthritis, unspecified: Secondary | ICD-10-CM | POA: Diagnosis not present

## 2022-12-31 DIAGNOSIS — K2991 Gastroduodenitis, unspecified, with bleeding: Secondary | ICD-10-CM | POA: Diagnosis not present

## 2022-12-31 DIAGNOSIS — J989 Respiratory disorder, unspecified: Secondary | ICD-10-CM | POA: Diagnosis not present

## 2022-12-31 DIAGNOSIS — J309 Allergic rhinitis, unspecified: Secondary | ICD-10-CM | POA: Diagnosis not present

## 2022-12-31 DIAGNOSIS — K2971 Gastritis, unspecified, with bleeding: Secondary | ICD-10-CM | POA: Diagnosis not present

## 2022-12-31 DIAGNOSIS — K589 Irritable bowel syndrome without diarrhea: Secondary | ICD-10-CM | POA: Diagnosis not present

## 2022-12-31 DIAGNOSIS — R7303 Prediabetes: Secondary | ICD-10-CM | POA: Diagnosis not present

## 2022-12-31 DIAGNOSIS — D649 Anemia, unspecified: Secondary | ICD-10-CM | POA: Diagnosis not present

## 2022-12-31 DIAGNOSIS — M159 Polyosteoarthritis, unspecified: Secondary | ICD-10-CM | POA: Diagnosis not present

## 2022-12-31 DIAGNOSIS — E785 Hyperlipidemia, unspecified: Secondary | ICD-10-CM | POA: Diagnosis not present

## 2022-12-31 DIAGNOSIS — G4700 Insomnia, unspecified: Secondary | ICD-10-CM | POA: Diagnosis not present

## 2023-01-28 DIAGNOSIS — R7303 Prediabetes: Secondary | ICD-10-CM | POA: Diagnosis not present

## 2023-01-28 DIAGNOSIS — J309 Allergic rhinitis, unspecified: Secondary | ICD-10-CM | POA: Diagnosis not present

## 2023-01-28 DIAGNOSIS — K589 Irritable bowel syndrome without diarrhea: Secondary | ICD-10-CM | POA: Diagnosis not present

## 2023-01-28 DIAGNOSIS — K2971 Gastritis, unspecified, with bleeding: Secondary | ICD-10-CM | POA: Diagnosis not present

## 2023-01-28 DIAGNOSIS — J989 Respiratory disorder, unspecified: Secondary | ICD-10-CM | POA: Diagnosis not present

## 2023-01-28 DIAGNOSIS — K2991 Gastroduodenitis, unspecified, with bleeding: Secondary | ICD-10-CM | POA: Diagnosis not present

## 2023-01-28 DIAGNOSIS — G4700 Insomnia, unspecified: Secondary | ICD-10-CM | POA: Diagnosis not present

## 2023-01-28 DIAGNOSIS — D649 Anemia, unspecified: Secondary | ICD-10-CM | POA: Diagnosis not present

## 2023-01-28 DIAGNOSIS — Z23 Encounter for immunization: Secondary | ICD-10-CM | POA: Diagnosis not present

## 2023-01-28 DIAGNOSIS — E785 Hyperlipidemia, unspecified: Secondary | ICD-10-CM | POA: Diagnosis not present

## 2023-01-28 DIAGNOSIS — M159 Polyosteoarthritis, unspecified: Secondary | ICD-10-CM | POA: Diagnosis not present

## 2023-02-12 DIAGNOSIS — K589 Irritable bowel syndrome without diarrhea: Secondary | ICD-10-CM | POA: Diagnosis not present

## 2023-02-12 DIAGNOSIS — J989 Respiratory disorder, unspecified: Secondary | ICD-10-CM | POA: Diagnosis not present

## 2023-02-12 DIAGNOSIS — R7303 Prediabetes: Secondary | ICD-10-CM | POA: Diagnosis not present

## 2023-02-12 DIAGNOSIS — D649 Anemia, unspecified: Secondary | ICD-10-CM | POA: Diagnosis not present

## 2023-02-12 DIAGNOSIS — K2971 Gastritis, unspecified, with bleeding: Secondary | ICD-10-CM | POA: Diagnosis not present

## 2023-02-12 DIAGNOSIS — K2991 Gastroduodenitis, unspecified, with bleeding: Secondary | ICD-10-CM | POA: Diagnosis not present

## 2023-02-12 DIAGNOSIS — J309 Allergic rhinitis, unspecified: Secondary | ICD-10-CM | POA: Diagnosis not present

## 2023-02-12 DIAGNOSIS — M159 Polyosteoarthritis, unspecified: Secondary | ICD-10-CM | POA: Diagnosis not present

## 2023-02-12 DIAGNOSIS — J069 Acute upper respiratory infection, unspecified: Secondary | ICD-10-CM | POA: Diagnosis not present

## 2023-02-12 DIAGNOSIS — G4700 Insomnia, unspecified: Secondary | ICD-10-CM | POA: Diagnosis not present

## 2023-02-12 DIAGNOSIS — E785 Hyperlipidemia, unspecified: Secondary | ICD-10-CM | POA: Diagnosis not present

## 2023-02-25 DIAGNOSIS — G4700 Insomnia, unspecified: Secondary | ICD-10-CM | POA: Diagnosis not present

## 2023-02-25 DIAGNOSIS — K2971 Gastritis, unspecified, with bleeding: Secondary | ICD-10-CM | POA: Diagnosis not present

## 2023-02-25 DIAGNOSIS — D649 Anemia, unspecified: Secondary | ICD-10-CM | POA: Diagnosis not present

## 2023-02-25 DIAGNOSIS — J989 Respiratory disorder, unspecified: Secondary | ICD-10-CM | POA: Diagnosis not present

## 2023-02-25 DIAGNOSIS — J309 Allergic rhinitis, unspecified: Secondary | ICD-10-CM | POA: Diagnosis not present

## 2023-02-25 DIAGNOSIS — M159 Polyosteoarthritis, unspecified: Secondary | ICD-10-CM | POA: Diagnosis not present

## 2023-02-25 DIAGNOSIS — K589 Irritable bowel syndrome without diarrhea: Secondary | ICD-10-CM | POA: Diagnosis not present

## 2023-02-25 DIAGNOSIS — R7303 Prediabetes: Secondary | ICD-10-CM | POA: Diagnosis not present

## 2023-02-25 DIAGNOSIS — K2991 Gastroduodenitis, unspecified, with bleeding: Secondary | ICD-10-CM | POA: Diagnosis not present

## 2023-02-25 DIAGNOSIS — E785 Hyperlipidemia, unspecified: Secondary | ICD-10-CM | POA: Diagnosis not present

## 2023-03-25 DIAGNOSIS — K589 Irritable bowel syndrome without diarrhea: Secondary | ICD-10-CM | POA: Diagnosis not present

## 2023-03-25 DIAGNOSIS — R7303 Prediabetes: Secondary | ICD-10-CM | POA: Diagnosis not present

## 2023-03-25 DIAGNOSIS — K2991 Gastroduodenitis, unspecified, with bleeding: Secondary | ICD-10-CM | POA: Diagnosis not present

## 2023-03-25 DIAGNOSIS — E785 Hyperlipidemia, unspecified: Secondary | ICD-10-CM | POA: Diagnosis not present

## 2023-03-25 DIAGNOSIS — J069 Acute upper respiratory infection, unspecified: Secondary | ICD-10-CM | POA: Diagnosis not present

## 2023-03-25 DIAGNOSIS — J309 Allergic rhinitis, unspecified: Secondary | ICD-10-CM | POA: Diagnosis not present

## 2023-03-25 DIAGNOSIS — M159 Polyosteoarthritis, unspecified: Secondary | ICD-10-CM | POA: Diagnosis not present

## 2023-03-25 DIAGNOSIS — G4700 Insomnia, unspecified: Secondary | ICD-10-CM | POA: Diagnosis not present

## 2023-03-25 DIAGNOSIS — J989 Respiratory disorder, unspecified: Secondary | ICD-10-CM | POA: Diagnosis not present

## 2023-03-25 DIAGNOSIS — K2971 Gastritis, unspecified, with bleeding: Secondary | ICD-10-CM | POA: Diagnosis not present

## 2023-04-30 DIAGNOSIS — K2971 Gastritis, unspecified, with bleeding: Secondary | ICD-10-CM | POA: Diagnosis not present

## 2023-04-30 DIAGNOSIS — G4700 Insomnia, unspecified: Secondary | ICD-10-CM | POA: Diagnosis not present

## 2023-04-30 DIAGNOSIS — K589 Irritable bowel syndrome without diarrhea: Secondary | ICD-10-CM | POA: Diagnosis not present

## 2023-04-30 DIAGNOSIS — J989 Respiratory disorder, unspecified: Secondary | ICD-10-CM | POA: Diagnosis not present

## 2023-04-30 DIAGNOSIS — M159 Polyosteoarthritis, unspecified: Secondary | ICD-10-CM | POA: Diagnosis not present

## 2023-04-30 DIAGNOSIS — E785 Hyperlipidemia, unspecified: Secondary | ICD-10-CM | POA: Diagnosis not present

## 2023-04-30 DIAGNOSIS — K2991 Gastroduodenitis, unspecified, with bleeding: Secondary | ICD-10-CM | POA: Diagnosis not present

## 2023-04-30 DIAGNOSIS — J309 Allergic rhinitis, unspecified: Secondary | ICD-10-CM | POA: Diagnosis not present

## 2023-04-30 DIAGNOSIS — D649 Anemia, unspecified: Secondary | ICD-10-CM | POA: Diagnosis not present

## 2023-04-30 DIAGNOSIS — R7303 Prediabetes: Secondary | ICD-10-CM | POA: Diagnosis not present

## 2023-05-27 DIAGNOSIS — I10 Essential (primary) hypertension: Secondary | ICD-10-CM | POA: Diagnosis not present

## 2023-05-27 DIAGNOSIS — J309 Allergic rhinitis, unspecified: Secondary | ICD-10-CM | POA: Diagnosis not present

## 2023-05-27 DIAGNOSIS — K2991 Gastroduodenitis, unspecified, with bleeding: Secondary | ICD-10-CM | POA: Diagnosis not present

## 2023-05-27 DIAGNOSIS — K2971 Gastritis, unspecified, with bleeding: Secondary | ICD-10-CM | POA: Diagnosis not present

## 2023-05-27 DIAGNOSIS — R7303 Prediabetes: Secondary | ICD-10-CM | POA: Diagnosis not present

## 2023-05-27 DIAGNOSIS — M159 Polyosteoarthritis, unspecified: Secondary | ICD-10-CM | POA: Diagnosis not present

## 2023-05-27 DIAGNOSIS — G4700 Insomnia, unspecified: Secondary | ICD-10-CM | POA: Diagnosis not present

## 2023-05-27 DIAGNOSIS — J989 Respiratory disorder, unspecified: Secondary | ICD-10-CM | POA: Diagnosis not present

## 2023-05-27 DIAGNOSIS — M65841 Other synovitis and tenosynovitis, right hand: Secondary | ICD-10-CM | POA: Diagnosis not present

## 2023-05-27 DIAGNOSIS — J069 Acute upper respiratory infection, unspecified: Secondary | ICD-10-CM | POA: Diagnosis not present

## 2023-06-10 DIAGNOSIS — R7303 Prediabetes: Secondary | ICD-10-CM | POA: Diagnosis not present

## 2023-06-10 DIAGNOSIS — K2991 Gastroduodenitis, unspecified, with bleeding: Secondary | ICD-10-CM | POA: Diagnosis not present

## 2023-06-10 DIAGNOSIS — J309 Allergic rhinitis, unspecified: Secondary | ICD-10-CM | POA: Diagnosis not present

## 2023-06-10 DIAGNOSIS — K589 Irritable bowel syndrome without diarrhea: Secondary | ICD-10-CM | POA: Diagnosis not present

## 2023-06-10 DIAGNOSIS — G4700 Insomnia, unspecified: Secondary | ICD-10-CM | POA: Diagnosis not present

## 2023-06-10 DIAGNOSIS — M159 Polyosteoarthritis, unspecified: Secondary | ICD-10-CM | POA: Diagnosis not present

## 2023-06-10 DIAGNOSIS — E785 Hyperlipidemia, unspecified: Secondary | ICD-10-CM | POA: Diagnosis not present

## 2023-06-10 DIAGNOSIS — J989 Respiratory disorder, unspecified: Secondary | ICD-10-CM | POA: Diagnosis not present

## 2023-06-10 DIAGNOSIS — K2971 Gastritis, unspecified, with bleeding: Secondary | ICD-10-CM | POA: Diagnosis not present

## 2023-07-15 DIAGNOSIS — R7303 Prediabetes: Secondary | ICD-10-CM | POA: Diagnosis not present

## 2023-07-15 DIAGNOSIS — R52 Pain, unspecified: Secondary | ICD-10-CM | POA: Diagnosis not present

## 2023-07-15 DIAGNOSIS — K2971 Gastritis, unspecified, with bleeding: Secondary | ICD-10-CM | POA: Diagnosis not present

## 2023-07-15 DIAGNOSIS — J309 Allergic rhinitis, unspecified: Secondary | ICD-10-CM | POA: Diagnosis not present

## 2023-07-15 DIAGNOSIS — M159 Polyosteoarthritis, unspecified: Secondary | ICD-10-CM | POA: Diagnosis not present

## 2023-07-15 DIAGNOSIS — E785 Hyperlipidemia, unspecified: Secondary | ICD-10-CM | POA: Diagnosis not present

## 2023-07-15 DIAGNOSIS — K589 Irritable bowel syndrome without diarrhea: Secondary | ICD-10-CM | POA: Diagnosis not present

## 2023-07-15 DIAGNOSIS — K2991 Gastroduodenitis, unspecified, with bleeding: Secondary | ICD-10-CM | POA: Diagnosis not present

## 2023-07-15 DIAGNOSIS — G4700 Insomnia, unspecified: Secondary | ICD-10-CM | POA: Diagnosis not present

## 2023-07-17 DIAGNOSIS — S42212A Unspecified displaced fracture of surgical neck of left humerus, initial encounter for closed fracture: Secondary | ICD-10-CM | POA: Diagnosis not present

## 2023-07-17 DIAGNOSIS — M7541 Impingement syndrome of right shoulder: Secondary | ICD-10-CM | POA: Diagnosis not present

## 2023-08-12 DIAGNOSIS — R7303 Prediabetes: Secondary | ICD-10-CM | POA: Diagnosis not present

## 2023-08-12 DIAGNOSIS — J309 Allergic rhinitis, unspecified: Secondary | ICD-10-CM | POA: Diagnosis not present

## 2023-08-12 DIAGNOSIS — K589 Irritable bowel syndrome without diarrhea: Secondary | ICD-10-CM | POA: Diagnosis not present

## 2023-08-12 DIAGNOSIS — K2991 Gastroduodenitis, unspecified, with bleeding: Secondary | ICD-10-CM | POA: Diagnosis not present

## 2023-08-12 DIAGNOSIS — K2971 Gastritis, unspecified, with bleeding: Secondary | ICD-10-CM | POA: Diagnosis not present

## 2023-08-12 DIAGNOSIS — E785 Hyperlipidemia, unspecified: Secondary | ICD-10-CM | POA: Diagnosis not present

## 2023-08-12 DIAGNOSIS — M159 Polyosteoarthritis, unspecified: Secondary | ICD-10-CM | POA: Diagnosis not present

## 2023-08-12 DIAGNOSIS — G4700 Insomnia, unspecified: Secondary | ICD-10-CM | POA: Diagnosis not present

## 2023-08-12 DIAGNOSIS — D649 Anemia, unspecified: Secondary | ICD-10-CM | POA: Diagnosis not present

## 2023-08-12 DIAGNOSIS — J989 Respiratory disorder, unspecified: Secondary | ICD-10-CM | POA: Diagnosis not present

## 2023-09-09 DIAGNOSIS — J309 Allergic rhinitis, unspecified: Secondary | ICD-10-CM | POA: Diagnosis not present

## 2023-09-09 DIAGNOSIS — R7303 Prediabetes: Secondary | ICD-10-CM | POA: Diagnosis not present

## 2023-09-09 DIAGNOSIS — K2991 Gastroduodenitis, unspecified, with bleeding: Secondary | ICD-10-CM | POA: Diagnosis not present

## 2023-09-09 DIAGNOSIS — K589 Irritable bowel syndrome without diarrhea: Secondary | ICD-10-CM | POA: Diagnosis not present

## 2023-09-09 DIAGNOSIS — K2971 Gastritis, unspecified, with bleeding: Secondary | ICD-10-CM | POA: Diagnosis not present

## 2023-09-09 DIAGNOSIS — E785 Hyperlipidemia, unspecified: Secondary | ICD-10-CM | POA: Diagnosis not present

## 2023-09-09 DIAGNOSIS — M159 Polyosteoarthritis, unspecified: Secondary | ICD-10-CM | POA: Diagnosis not present

## 2023-09-09 DIAGNOSIS — G4700 Insomnia, unspecified: Secondary | ICD-10-CM | POA: Diagnosis not present

## 2023-09-09 DIAGNOSIS — J989 Respiratory disorder, unspecified: Secondary | ICD-10-CM | POA: Diagnosis not present

## 2023-09-09 DIAGNOSIS — D649 Anemia, unspecified: Secondary | ICD-10-CM | POA: Diagnosis not present

## 2023-10-07 DIAGNOSIS — M159 Polyosteoarthritis, unspecified: Secondary | ICD-10-CM | POA: Diagnosis not present

## 2023-10-07 DIAGNOSIS — J989 Respiratory disorder, unspecified: Secondary | ICD-10-CM | POA: Diagnosis not present

## 2023-10-07 DIAGNOSIS — K2991 Gastroduodenitis, unspecified, with bleeding: Secondary | ICD-10-CM | POA: Diagnosis not present

## 2023-10-07 DIAGNOSIS — K589 Irritable bowel syndrome without diarrhea: Secondary | ICD-10-CM | POA: Diagnosis not present

## 2023-10-07 DIAGNOSIS — K2971 Gastritis, unspecified, with bleeding: Secondary | ICD-10-CM | POA: Diagnosis not present

## 2023-10-07 DIAGNOSIS — J309 Allergic rhinitis, unspecified: Secondary | ICD-10-CM | POA: Diagnosis not present

## 2023-10-07 DIAGNOSIS — E785 Hyperlipidemia, unspecified: Secondary | ICD-10-CM | POA: Diagnosis not present

## 2023-10-07 DIAGNOSIS — D649 Anemia, unspecified: Secondary | ICD-10-CM | POA: Diagnosis not present

## 2023-10-07 DIAGNOSIS — G4700 Insomnia, unspecified: Secondary | ICD-10-CM | POA: Diagnosis not present

## 2023-10-07 DIAGNOSIS — R7303 Prediabetes: Secondary | ICD-10-CM | POA: Diagnosis not present

## 2023-10-08 DIAGNOSIS — J45909 Unspecified asthma, uncomplicated: Secondary | ICD-10-CM | POA: Diagnosis not present

## 2023-10-08 DIAGNOSIS — Z9889 Other specified postprocedural states: Secondary | ICD-10-CM | POA: Diagnosis not present

## 2023-10-08 DIAGNOSIS — E785 Hyperlipidemia, unspecified: Secondary | ICD-10-CM | POA: Diagnosis not present

## 2023-10-08 DIAGNOSIS — R7303 Prediabetes: Secondary | ICD-10-CM | POA: Diagnosis not present

## 2023-10-08 DIAGNOSIS — Z79891 Long term (current) use of opiate analgesic: Secondary | ICD-10-CM | POA: Diagnosis not present

## 2023-10-08 DIAGNOSIS — D538 Other specified nutritional anemias: Secondary | ICD-10-CM | POA: Diagnosis not present

## 2023-10-08 DIAGNOSIS — G4700 Insomnia, unspecified: Secondary | ICD-10-CM | POA: Diagnosis not present

## 2023-10-08 DIAGNOSIS — M25511 Pain in right shoulder: Secondary | ICD-10-CM | POA: Diagnosis not present

## 2023-10-08 DIAGNOSIS — K589 Irritable bowel syndrome without diarrhea: Secondary | ICD-10-CM | POA: Diagnosis not present

## 2023-10-08 DIAGNOSIS — J449 Chronic obstructive pulmonary disease, unspecified: Secondary | ICD-10-CM | POA: Diagnosis not present

## 2023-11-07 DIAGNOSIS — D649 Anemia, unspecified: Secondary | ICD-10-CM | POA: Diagnosis not present

## 2023-11-07 DIAGNOSIS — J309 Allergic rhinitis, unspecified: Secondary | ICD-10-CM | POA: Diagnosis not present

## 2023-11-07 DIAGNOSIS — E785 Hyperlipidemia, unspecified: Secondary | ICD-10-CM | POA: Diagnosis not present

## 2023-11-07 DIAGNOSIS — K2971 Gastritis, unspecified, with bleeding: Secondary | ICD-10-CM | POA: Diagnosis not present

## 2023-11-07 DIAGNOSIS — J989 Respiratory disorder, unspecified: Secondary | ICD-10-CM | POA: Diagnosis not present

## 2023-11-07 DIAGNOSIS — G4700 Insomnia, unspecified: Secondary | ICD-10-CM | POA: Diagnosis not present

## 2023-11-07 DIAGNOSIS — M159 Polyosteoarthritis, unspecified: Secondary | ICD-10-CM | POA: Diagnosis not present

## 2023-11-07 DIAGNOSIS — K2991 Gastroduodenitis, unspecified, with bleeding: Secondary | ICD-10-CM | POA: Diagnosis not present

## 2023-11-07 DIAGNOSIS — K589 Irritable bowel syndrome without diarrhea: Secondary | ICD-10-CM | POA: Diagnosis not present

## 2023-11-07 DIAGNOSIS — R7303 Prediabetes: Secondary | ICD-10-CM | POA: Diagnosis not present

## 2023-11-10 DIAGNOSIS — Z9889 Other specified postprocedural states: Secondary | ICD-10-CM | POA: Diagnosis not present

## 2023-11-10 DIAGNOSIS — J449 Chronic obstructive pulmonary disease, unspecified: Secondary | ICD-10-CM | POA: Diagnosis not present

## 2023-11-10 DIAGNOSIS — R7303 Prediabetes: Secondary | ICD-10-CM | POA: Diagnosis not present

## 2023-11-10 DIAGNOSIS — Z79891 Long term (current) use of opiate analgesic: Secondary | ICD-10-CM | POA: Diagnosis not present

## 2023-11-10 DIAGNOSIS — M150 Primary generalized (osteo)arthritis: Secondary | ICD-10-CM | POA: Diagnosis not present

## 2023-11-10 DIAGNOSIS — K589 Irritable bowel syndrome without diarrhea: Secondary | ICD-10-CM | POA: Diagnosis not present

## 2023-11-10 DIAGNOSIS — D538 Other specified nutritional anemias: Secondary | ICD-10-CM | POA: Diagnosis not present

## 2023-11-10 DIAGNOSIS — G4700 Insomnia, unspecified: Secondary | ICD-10-CM | POA: Diagnosis not present

## 2023-11-20 DIAGNOSIS — E785 Hyperlipidemia, unspecified: Secondary | ICD-10-CM | POA: Diagnosis not present

## 2023-11-20 DIAGNOSIS — K589 Irritable bowel syndrome without diarrhea: Secondary | ICD-10-CM | POA: Diagnosis not present

## 2023-11-20 DIAGNOSIS — K2991 Gastroduodenitis, unspecified, with bleeding: Secondary | ICD-10-CM | POA: Diagnosis not present

## 2023-11-20 DIAGNOSIS — K2971 Gastritis, unspecified, with bleeding: Secondary | ICD-10-CM | POA: Diagnosis not present

## 2023-11-20 DIAGNOSIS — R7303 Prediabetes: Secondary | ICD-10-CM | POA: Diagnosis not present

## 2023-11-20 DIAGNOSIS — J309 Allergic rhinitis, unspecified: Secondary | ICD-10-CM | POA: Diagnosis not present

## 2023-11-20 DIAGNOSIS — G4700 Insomnia, unspecified: Secondary | ICD-10-CM | POA: Diagnosis not present

## 2023-11-20 DIAGNOSIS — J449 Chronic obstructive pulmonary disease, unspecified: Secondary | ICD-10-CM | POA: Diagnosis not present

## 2023-11-20 DIAGNOSIS — M159 Polyosteoarthritis, unspecified: Secondary | ICD-10-CM | POA: Diagnosis not present

## 2023-11-20 DIAGNOSIS — J069 Acute upper respiratory infection, unspecified: Secondary | ICD-10-CM | POA: Diagnosis not present

## 2023-12-09 DIAGNOSIS — R7303 Prediabetes: Secondary | ICD-10-CM | POA: Diagnosis not present

## 2023-12-09 DIAGNOSIS — M159 Polyosteoarthritis, unspecified: Secondary | ICD-10-CM | POA: Diagnosis not present

## 2023-12-09 DIAGNOSIS — Z9181 History of falling: Secondary | ICD-10-CM | POA: Diagnosis not present

## 2023-12-09 DIAGNOSIS — L0390 Cellulitis, unspecified: Secondary | ICD-10-CM | POA: Diagnosis not present

## 2023-12-09 DIAGNOSIS — Z0000 Encounter for general adult medical examination without abnormal findings: Secondary | ICD-10-CM | POA: Diagnosis not present

## 2023-12-09 DIAGNOSIS — J449 Chronic obstructive pulmonary disease, unspecified: Secondary | ICD-10-CM | POA: Diagnosis not present

## 2023-12-09 DIAGNOSIS — E785 Hyperlipidemia, unspecified: Secondary | ICD-10-CM | POA: Diagnosis not present

## 2023-12-09 DIAGNOSIS — J309 Allergic rhinitis, unspecified: Secondary | ICD-10-CM | POA: Diagnosis not present

## 2023-12-18 DIAGNOSIS — Z9889 Other specified postprocedural states: Secondary | ICD-10-CM | POA: Diagnosis not present

## 2023-12-18 DIAGNOSIS — R7303 Prediabetes: Secondary | ICD-10-CM | POA: Diagnosis not present

## 2023-12-18 DIAGNOSIS — D538 Other specified nutritional anemias: Secondary | ICD-10-CM | POA: Diagnosis not present

## 2023-12-18 DIAGNOSIS — M25512 Pain in left shoulder: Secondary | ICD-10-CM | POA: Diagnosis not present

## 2023-12-18 DIAGNOSIS — E785 Hyperlipidemia, unspecified: Secondary | ICD-10-CM | POA: Diagnosis not present

## 2023-12-18 DIAGNOSIS — J449 Chronic obstructive pulmonary disease, unspecified: Secondary | ICD-10-CM | POA: Diagnosis not present

## 2023-12-18 DIAGNOSIS — K589 Irritable bowel syndrome without diarrhea: Secondary | ICD-10-CM | POA: Diagnosis not present

## 2023-12-18 DIAGNOSIS — Z79891 Long term (current) use of opiate analgesic: Secondary | ICD-10-CM | POA: Diagnosis not present

## 2023-12-18 DIAGNOSIS — M1611 Unilateral primary osteoarthritis, right hip: Secondary | ICD-10-CM | POA: Diagnosis not present

## 2023-12-18 DIAGNOSIS — M1711 Unilateral primary osteoarthritis, right knee: Secondary | ICD-10-CM | POA: Diagnosis not present

## 2024-01-06 DIAGNOSIS — M159 Polyosteoarthritis, unspecified: Secondary | ICD-10-CM | POA: Diagnosis not present

## 2024-01-06 DIAGNOSIS — J449 Chronic obstructive pulmonary disease, unspecified: Secondary | ICD-10-CM | POA: Diagnosis not present

## 2024-01-06 DIAGNOSIS — K589 Irritable bowel syndrome without diarrhea: Secondary | ICD-10-CM | POA: Diagnosis not present

## 2024-01-06 DIAGNOSIS — K2971 Gastritis, unspecified, with bleeding: Secondary | ICD-10-CM | POA: Diagnosis not present

## 2024-01-06 DIAGNOSIS — K2991 Gastroduodenitis, unspecified, with bleeding: Secondary | ICD-10-CM | POA: Diagnosis not present

## 2024-01-06 DIAGNOSIS — J309 Allergic rhinitis, unspecified: Secondary | ICD-10-CM | POA: Diagnosis not present

## 2024-01-06 DIAGNOSIS — R7303 Prediabetes: Secondary | ICD-10-CM | POA: Diagnosis not present

## 2024-01-06 DIAGNOSIS — D649 Anemia, unspecified: Secondary | ICD-10-CM | POA: Diagnosis not present

## 2024-01-06 DIAGNOSIS — G4700 Insomnia, unspecified: Secondary | ICD-10-CM | POA: Diagnosis not present

## 2024-01-06 DIAGNOSIS — E785 Hyperlipidemia, unspecified: Secondary | ICD-10-CM | POA: Diagnosis not present
